# Patient Record
Sex: Female | Born: 1995 | Race: Asian | Hispanic: No | Marital: Single | Smoking: Never smoker
Health system: Southern US, Community
[De-identification: ages and names within clinical notes are randomized; demographics above are authoritative.]

## PROBLEM LIST (undated history)

## (undated) DIAGNOSIS — Z789 Other specified health status: Secondary | ICD-10-CM

## (undated) DIAGNOSIS — R55 Syncope and collapse: Secondary | ICD-10-CM

## (undated) HISTORY — PX: NO PAST SURGERIES: SHX2092

## (undated) HISTORY — PX: DILATION AND CURETTAGE OF UTERUS: SHX78

---
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
## 2015-01-22 NOTE — L&D Delivery Note (Signed)
Delivery Note At 9:02 AM a viable female was delivered via Vaginal, Spontaneous Delivery (Presentation: ROA ; Vertex).  APGAR: 9, 9; weight pending .   Placenta status: delivered with gently traction.  Cord: 3 vessel with the following complications: nuchal x1.  Cord pH: not colected  Anesthesia:  Lidocaine for repair Episiotomy: None Lacerations: 1st degree;Perineal Suture Repair: 3.0 vicryl Est. Blood Loss (mL):  100  Mom to postpartum.  Baby to Couplet care / Skin to Skin.  Kathleen Pennaicholas Zavior Bell 11/18/2015, 9:16 AM

## 2015-05-08 ENCOUNTER — Encounter (HOSPITAL_COMMUNITY): Payer: Self-pay

## 2015-05-08 DIAGNOSIS — R1084 Generalized abdominal pain: Secondary | ICD-10-CM | POA: Insufficient documentation

## 2015-05-08 DIAGNOSIS — R197 Diarrhea, unspecified: Secondary | ICD-10-CM | POA: Diagnosis not present

## 2015-05-08 DIAGNOSIS — R42 Dizziness and giddiness: Secondary | ICD-10-CM | POA: Diagnosis not present

## 2015-05-08 DIAGNOSIS — R350 Frequency of micturition: Secondary | ICD-10-CM | POA: Diagnosis not present

## 2015-05-08 DIAGNOSIS — O9989 Other specified diseases and conditions complicating pregnancy, childbirth and the puerperium: Secondary | ICD-10-CM | POA: Diagnosis present

## 2015-05-08 DIAGNOSIS — Z3481 Encounter for supervision of other normal pregnancy, first trimester: Secondary | ICD-10-CM | POA: Insufficient documentation

## 2015-05-08 DIAGNOSIS — Z3A01 Less than 8 weeks gestation of pregnancy: Secondary | ICD-10-CM | POA: Diagnosis not present

## 2015-05-08 DIAGNOSIS — Z79899 Other long term (current) drug therapy: Secondary | ICD-10-CM | POA: Insufficient documentation

## 2015-05-08 DIAGNOSIS — R05 Cough: Secondary | ICD-10-CM | POA: Diagnosis not present

## 2015-05-08 LAB — URINALYSIS, ROUTINE W REFLEX MICROSCOPIC
Bilirubin Urine: NEGATIVE
GLUCOSE, UA: NEGATIVE mg/dL
HGB URINE DIPSTICK: NEGATIVE
Ketones, ur: NEGATIVE mg/dL
Leukocytes, UA: NEGATIVE
Nitrite: NEGATIVE
PH: 6 (ref 5.0–8.0)
Protein, ur: NEGATIVE mg/dL
SPECIFIC GRAVITY, URINE: 1.015 (ref 1.005–1.030)

## 2015-05-08 LAB — COMPREHENSIVE METABOLIC PANEL
ALT: 18 U/L (ref 14–54)
AST: 19 U/L (ref 15–41)
Albumin: 3.8 g/dL (ref 3.5–5.0)
Alkaline Phosphatase: 42 U/L (ref 38–126)
Anion gap: 8 (ref 5–15)
BUN: 6 mg/dL (ref 6–20)
CHLORIDE: 103 mmol/L (ref 101–111)
CO2: 24 mmol/L (ref 22–32)
CREATININE: 0.57 mg/dL (ref 0.44–1.00)
Calcium: 9.5 mg/dL (ref 8.9–10.3)
GFR calc non Af Amer: >60 mL/min (ref >60)
Glucose, Bld: 90 mg/dL (ref 65–99)
POTASSIUM: 3.8 mmol/L (ref 3.5–5.1)
SODIUM: 135 mmol/L (ref 135–145)
Total Bilirubin: 0.6 mg/dL (ref 0.3–1.2)
Total Protein: 7.8 g/dL (ref 6.5–8.1)

## 2015-05-08 LAB — CBC
HEMATOCRIT: 37.5 % (ref 36.0–46.0)
HEMOGLOBIN: 12.7 g/dL (ref 12.0–15.0)
MCH: 29.2 pg (ref 26.0–34.0)
MCHC: 33.9 g/dL (ref 30.0–36.0)
MCV: 86.2 fL (ref 78.0–100.0)
PLATELETS: 242 10*3/uL (ref 150–400)
RBC: 4.35 MIL/uL (ref 3.87–5.11)
RDW: 12.9 % (ref 11.5–15.5)
WBC: 9.2 10*3/uL (ref 4.0–10.5)

## 2015-05-08 LAB — LIPASE, BLOOD: LIPASE: 30 U/L (ref 11–51)

## 2015-05-08 LAB — HCG, QUANTITATIVE, PREGNANCY: hCG, Beta Chain, Quant, S: 190507 m[IU]/mL — ABNORMAL HIGH (ref <5)

## 2015-05-08 MED ORDER — ONDANSETRON 4 MG PO TBDP
4.0000 mg | ORAL_TABLET | Freq: Once | ORAL | Status: AC | PRN
Start: 2015-05-08 — End: 2015-05-08
  Administered 2015-05-08: 4 mg via ORAL

## 2015-05-08 MED ORDER — ONDANSETRON 4 MG PO TBDP
ORAL_TABLET | ORAL | Status: AC
  Filled 2015-05-08: qty 1

## 2015-05-08 NOTE — ED Notes (Addendum)
Pt complaining of abdominal pain and diarrhea since yesterday. Pt complaining of nausea. Pt states she is [redacted]wks pregnant.

## 2015-05-09 ENCOUNTER — Emergency Department (HOSPITAL_COMMUNITY)
Admission: EM | Admit: 2015-05-09 | Discharge: 2015-05-09 | Disposition: A | Discharge status: Home / Self Care | Payer: Medicaid Other | Attending: Emergency Medicine | Admitting: Emergency Medicine

## 2015-05-09 DIAGNOSIS — Z3490 Encounter for supervision of normal pregnancy, unspecified, unspecified trimester: Secondary | ICD-10-CM

## 2015-05-09 MED ORDER — OXYCODONE-ACETAMINOPHEN 5-325 MG PO TABS
1.0000 | ORAL_TABLET | Freq: Once | ORAL | Status: AC
  Administered 2015-05-09: 1 via ORAL
  Filled 2015-05-09: qty 1

## 2015-05-09 NOTE — Discharge Instructions (Signed)
First Trimester of Pregnancy The first trimester of pregnancy is from week 1 until the end of week 12 (months 1 through 3). A week after a sperm fertilizes an egg, the egg will implant on the wall of the uterus. This embryo will begin to develop into a baby. Genes from you and your partner are forming the baby. The female genes determine whether the baby is a boy or a girl. At 6-8 weeks, the eyes and face are formed, and the heartbeat can be seen on ultrasound. At the end of 12 weeks, all the baby's organs are formed.  Now that you are pregnant, you will want to do everything you can to have a healthy baby. Two of the most important things are to get good prenatal care and to follow your health care provider's instructions. Prenatal care is all the medical care you receive before the baby's birth. This care will help prevent, find, and treat any problems during the pregnancy and childbirth. BODY CHANGES Your body goes through many changes during pregnancy. The changes vary from woman to woman.   You may gain or lose a couple of pounds at first.  You may feel sick to your stomach (nauseous) and throw up (vomit). If the vomiting is uncontrollable, call your health care provider.  You may tire easily.  You may develop headaches that can be relieved by medicines approved by your health care provider.  You may urinate more often. Painful urination may mean you have a bladder infection.  You may develop heartburn as a result of your pregnancy.  You may develop constipation because certain hormones are causing the muscles that push waste through your intestines to slow down.  You may develop hemorrhoids or swollen, bulging veins (varicose veins).  Your breasts may begin to grow larger and become tender. Your nipples may stick out more, and the tissue that surrounds them (areola) may become darker.  Your gums may bleed and may be sensitive to brushing and flossing.  Dark spots or blotches (chloasma,  mask of pregnancy) may develop on your face. This will likely fade after the baby is born.  Your menstrual periods will stop.  You may have a loss of appetite.  You may develop cravings for certain kinds of food.  You may have changes in your emotions from day to day, such as being excited to be pregnant or being concerned that something may go wrong with the pregnancy and baby.  You may have more vivid and strange dreams.  You may have changes in your hair. These can include thickening of your hair, rapid growth, and changes in texture. Some women also have hair loss during or after pregnancy, or hair that feels dry or thin. Your hair will most likely return to normal after your baby is born. WHAT TO EXPECT AT YOUR PRENATAL VISITS During a routine prenatal visit:  You will be weighed to make sure you and the baby are growing normally.  Your blood pressure will be taken.  Your abdomen will be measured to track your baby's growth.  The fetal heartbeat will be listened to starting around week 10 or 12 of your pregnancy.  Test results from any previous visits will be discussed. Your health care provider may ask you:  How you are feeling.  If you are feeling the baby move.  If you have had any abnormal symptoms, such as leaking fluid, bleeding, severe headaches, or abdominal cramping.  If you are using any tobacco products,   including cigarettes, chewing tobacco, and electronic cigarettes.  If you have any questions. Other tests that may be performed during your first trimester include:  Blood tests to find your blood type and to check for the presence of any previous infections. They will also be used to check for low iron levels (anemia) and Rh antibodies. Later in the pregnancy, blood tests for diabetes will be done along with other tests if problems develop.  Urine tests to check for infections, diabetes, or protein in the urine.  An ultrasound to confirm the proper growth  and development of the baby.  An amniocentesis to check for possible genetic problems.  Fetal screens for spina bifida and Down syndrome.  You may need other tests to make sure you and the baby are doing well.  HIV (human immunodeficiency virus) testing. Routine prenatal testing includes screening for HIV, unless you choose not to have this test. HOME CARE INSTRUCTIONS  Medicines  Follow your health care provider's instructions regarding medicine use. Specific medicines may be either safe or unsafe to take during pregnancy.  Take your prenatal vitamins as directed.  If you develop constipation, try taking a stool softener if your health care provider approves. Diet  Eat regular, well-balanced meals. Choose a variety of foods, such as meat or vegetable-based protein, fish, milk and low-fat dairy products, vegetables, fruits, and whole grain breads and cereals. Your health care provider will help you determine the amount of weight gain that is right for you.  Avoid raw meat and uncooked cheese. These carry germs that can cause birth defects in the baby.  Eating four or five small meals rather than three large meals a day may help relieve nausea and vomiting. If you start to feel nauseous, eating a few soda crackers can be helpful. Drinking liquids between meals instead of during meals also seems to help nausea and vomiting.  If you develop constipation, eat more high-fiber foods, such as fresh vegetables or fruit and whole grains. Drink enough fluids to keep your urine clear or pale yellow. Activity and Exercise  Exercise only as directed by your health care provider. Exercising will help you:  Control your weight.  Stay in shape.  Be prepared for labor and delivery.  Experiencing pain or cramping in the lower abdomen or low back is a good sign that you should stop exercising. Check with your health care provider before continuing normal exercises.  Try to avoid standing for long  periods of time. Move your legs often if you must stand in one place for a long time.  Avoid heavy lifting.  Wear low-heeled shoes, and practice good posture.  You may continue to have sex unless your health care provider directs you otherwise. Relief of Pain or Discomfort  Wear a good support bra for breast tenderness.   Take warm sitz baths to soothe any pain or discomfort caused by hemorrhoids. Use hemorrhoid cream if your health care provider approves.   Rest with your legs elevated if you have leg cramps or low back pain.  If you develop varicose veins in your legs, wear support hose. Elevate your feet for 15 minutes, 3-4 times a day. Limit salt in your diet. Prenatal Care  Schedule your prenatal visits by the twelfth week of pregnancy. They are usually scheduled monthly at first, then more often in the last 2 months before delivery.  Write down your questions. Take them to your prenatal visits.  Keep all your prenatal visits as directed by your   health care provider. Safety  Wear your seat belt at all times when driving.  Make a list of emergency phone numbers, including numbers for family, friends, the hospital, and police and fire departments. General Tips  Ask your health care provider for a referral to a local prenatal education class. Begin classes no later than at the beginning of month 6 of your pregnancy.  Ask for help if you have counseling or nutritional needs during pregnancy. Your health care provider can offer advice or refer you to specialists for help with various needs.  Do not use hot tubs, steam rooms, or saunas.  Do not douche or use tampons or scented sanitary pads.  Do not cross your legs for long periods of time.  Avoid cat litter boxes and soil used by cats. These carry germs that can cause birth defects in the baby and possibly loss of the fetus by miscarriage or stillbirth.  Avoid all smoking, herbs, alcohol, and medicines not prescribed by  your health care provider. Chemicals in these affect the formation and growth of the baby.  Do not use any tobacco products, including cigarettes, chewing tobacco, and electronic cigarettes. If you need help quitting, ask your health care provider. You may receive counseling support and other resources to help you quit.  Schedule a dentist appointment. At home, brush your teeth with a soft toothbrush and be gentle when you floss. SEEK MEDICAL CARE IF:   You have dizziness.  You have mild pelvic cramps, pelvic pressure, or nagging pain in the abdominal area.  You have persistent nausea, vomiting, or diarrhea.  You have a bad smelling vaginal discharge.  You have pain with urination.  You notice increased swelling in your face, hands, legs, or ankles. SEEK IMMEDIATE MEDICAL CARE IF:   You have a fever.  You are leaking fluid from your vagina.  You have spotting or bleeding from your vagina.  You have severe abdominal cramping or pain.  You have rapid weight gain or loss.  You vomit blood or material that looks like coffee grounds.  You are exposed to German measles and have never had them.  You are exposed to fifth disease or chickenpox.  You develop a severe headache.  You have shortness of breath.  You have any kind of trauma, such as from a fall or a car accident.   This information is not intended to replace advice given to you by your health care provider. Make sure you discuss any questions you have with your health care provider.   Document Released: 01/01/2001 Document Revised: 01/28/2014 Document Reviewed: 11/17/2012 Elsevier Interactive Patient Education 2016 [employer]  

## 2015-05-09 NOTE — ED Provider Notes (Signed)
CSN: [id number]     Arrival date & time 05/08/15  2105 History  By signing my name below, I, Budd Palmer, attest that this documentation has been prepared under the direction and in the presence of Azalia Bilis, MD. Electronically Signed: Budd Palmer, ED Scribe. 05/09/2015. 3:46 AM.      Chief Complaint  Patient presents with  . Abdominal Pain   The history is provided by the patient. No language interpreter was used.   HPI Comments: Kathleen Bell is a 20 y.o. female who presents to the Emergency Department complaining of diffuse abdominal pain onset last night. She reports associated increased urine output and frequency, diarrhea, nausea, vomiting, dizziness, and cough. She notes exacerbation of the pain with palpation. She has taken tylenol for the pain without relief. She states she is currently [redacted] weeks pregnant with her second child, and has a 41 year old at home. She reports G1 P1. Pt denies dysuria, abnormal vaginal bleeding or discharge, and abdominal distension.   Past Medical History  Diagnosis Date  . Fainting    History reviewed. No pertinent past surgical history. History reviewed. No pertinent family history. Social History  Substance Use Topics  . Smoking status: Never Smoker   . Smokeless tobacco: None  . Alcohol Use: No   OB History    No data available     Review of Systems A complete 10 system review of systems was obtained and all systems are negative except as noted in the HPI and PMH.   Allergies  Fish allergy  Home Medications   Prior to Admission medications   Medication Sig Start Date End Date Taking? Authorizing Provider  acetaminophen (TYLENOL) 500 MG tablet Take 1,000 mg by mouth every 6 (six) hours as needed for mild pain.    Yes Historical Provider, MD  Prenatal Vit-Fe Fumarate-FA (PRENATAL MULTIVITAMIN) TABS tablet Take 1 tablet by mouth daily at 12 noon.   Yes Historical Provider, MD   BP 117/79 mmHg  Pulse 114  Temp(Src) 98.3 F (36.8  C) (Oral)  Resp 20  Ht  (1.575 m)  Wt 113 lb 6 oz (51.427 kg)  BMI 20.73 kg/m2  SpO2 98% Physical Exam  Constitutional: She is oriented to person, place, and time. She appears well-developed and well-nourished.  HENT:  Head: Normocephalic.  Eyes: EOM are normal.  Neck: Normal range of motion.  Pulmonary/Chest: Effort normal.  Abdominal: She exhibits no distension. There is tenderness (suprapubic).  Musculoskeletal: Normal range of motion.  Neurological: She is alert and oriented to person, place, and time.  Psychiatric: She has a normal mood and affect.  Nursing note and vitals reviewed.   ED Course  Procedures  DIAGNOSTIC STUDIES: Oxygen Saturation is 98% on RA, normal by my interpretation.    COORDINATION OF CARE: 3:46 AM - Discussed plans to order something for pain and to order a [place]. Pt advised of plan for treatment and pt agrees.    EMERGENCY DEPARTMENT [place] PREGNANCY "Study: Limited Ultrasound of the Pelvis for Pregnancy" INDICATIONS:Pregnancy(required) and Pelvic pain Multiple views of the uterus and pelvic cavity were obtained in real-time with a multi-frequency probe. APPROACH:Transabdominal  PERFORMED BY: Myself IMAGES ARCHIVED?: Yes LIMITATIONS: none PREGNANCY FREE FLUID: None ADNEXAL FINDINGS:No abnormalities noted PREGNANCY FINDINGS: Intrauterine gestational sac noted, Fetal pole present and Fetal heart activity seen INTERPRETATION: Viable intrauterine pregnancy and Pelvic free fluid absent GESTATIONAL AGE, ESTIMATE: 5 weeks FETAL HEART RATE: 158   Labs Review Labs Reviewed  HCG, QUANTITATIVE, PREGNANCY -  Abnormal; Notable for the following:    hCG, Beta Chain, Quant, S [id number] (*)    All other components within normal limits  LIPASE, BLOOD  COMPREHENSIVE METABOLIC PANEL  CBC  URINALYSIS, ROUTINE W REFLEX MICROSCOPIC (NOT AT [facility])    Imaging Review No results found. I have personally reviewed and evaluated these images and lab results as part  of my medical decision-making.   EKG Interpretation None      MDM   Final diagnoses:  Intrauterine pregnancy    Patient overall well-appearing.  Vital signs are normal.  Patient's bedside ultrasound demonstrates intrauterine pregnancy with normal fetal heart rate.  Child noted be moving.  Obstetrical follow-up.  She understands return to the ER for new or worsening symptoms  I personally performed the services described in this documentation, which was scribed in my presence. The recorded information has been reviewed and is accurate.       Azalia BilisKevin Laquetta Racey, MD 05/10/15 [phone]

## 2015-05-18 ENCOUNTER — Other Ambulatory Visit (HOSPITAL_COMMUNITY): Payer: Self-pay | Admitting: Nurse Practitioner

## 2015-05-18 DIAGNOSIS — Z3A12 12 weeks gestation of pregnancy: Secondary | ICD-10-CM

## 2015-05-18 DIAGNOSIS — Z3682 Encounter for antenatal screening for nuchal translucency: Secondary | ICD-10-CM

## 2015-05-18 LAB — OB RESULTS CONSOLE ABO/RH: RH TYPE: POSITIVE

## 2015-05-18 LAB — OB RESULTS CONSOLE HEPATITIS B SURFACE ANTIGEN: Hepatitis B Surface Ag: NEGATIVE

## 2015-05-18 LAB — OB RESULTS CONSOLE RPR: RPR: NONREACTIVE

## 2015-05-18 LAB — OB RESULTS CONSOLE GC/CHLAMYDIA
Chlamydia: NEGATIVE
GC PROBE AMP, GENITAL: NEGATIVE

## 2015-05-18 LAB — OB RESULTS CONSOLE RUBELLA ANTIBODY, IGM: RUBELLA: IMMUNE

## 2015-05-18 LAB — OB RESULTS CONSOLE HIV ANTIBODY (ROUTINE TESTING): HIV: NONREACTIVE

## 2015-05-18 LAB — OB RESULTS CONSOLE ANTIBODY SCREEN: Antibody Screen: NEGATIVE

## 2015-05-19 ENCOUNTER — Ambulatory Visit (HOSPITAL_COMMUNITY)
Admission: RE | Admit: 2015-05-19 | Discharge: 2015-05-19 | Disposition: A | Discharge status: Home / Self Care | Payer: Medicaid Other | Source: Ambulatory Visit | Attending: Nurse Practitioner | Admitting: Nurse Practitioner

## 2015-05-19 ENCOUNTER — Encounter (HOSPITAL_COMMUNITY): Payer: Self-pay

## 2015-05-19 DIAGNOSIS — Z36 Encounter for antenatal screening of mother: Secondary | ICD-10-CM | POA: Insufficient documentation

## 2015-05-19 DIAGNOSIS — Z3A12 12 weeks gestation of pregnancy: Secondary | ICD-10-CM | POA: Insufficient documentation

## 2015-05-19 DIAGNOSIS — Z3682 Encounter for antenatal screening for nuchal translucency: Secondary | ICD-10-CM

## 2015-05-29 ENCOUNTER — Other Ambulatory Visit (HOSPITAL_COMMUNITY): Payer: Self-pay

## 2015-06-12 ENCOUNTER — Ambulatory Visit (HOSPITAL_COMMUNITY)
Admission: EM | Admit: 2015-06-12 | Discharge: 2015-06-12 | Disposition: A | Discharge status: Home / Self Care | Payer: Medicaid Other | Attending: Family Medicine | Admitting: Family Medicine

## 2015-06-12 ENCOUNTER — Encounter (HOSPITAL_COMMUNITY): Payer: Self-pay | Admitting: Emergency Medicine

## 2015-06-12 DIAGNOSIS — L299 Pruritus, unspecified: Secondary | ICD-10-CM

## 2015-06-12 MED ORDER — CETIRIZINE HCL 10 MG PO TABS: 10.0000 mg | ORAL_TABLET | Freq: Every day | ORAL | Status: DC

## 2015-06-12 NOTE — ED Notes (Addendum)
[facility] a complaint of itching in her arms and hands x 2 months. The patient stated that the itching has gotten worse over the last several days. The patient denied any rash and none was obviously present.

## 2015-06-12 NOTE — ED Provider Notes (Signed)
CSN: [id number]     Arrival date & time 06/12/15  1908 History   First MD Initiated Contact with Patient 06/12/15 1944     Chief Complaint  Patient presents with  . Pruritis   (Consider location/radiation/quality/duration/timing/severity/associated sxs/prior Treatment) Patient is a 20 y.o. female presenting with rash. The history is provided by the patient.  Rash Location:  Full body Quality: dryness, itchiness and redness   Severity:  Moderate Duration:  2 months Timing:  Constant Progression:  Worsening (worse over past sev day) Chronicity:  New   Past Medical History  Diagnosis Date  . Fainting    History reviewed. No pertinent past surgical history. History reviewed. No pertinent family history. Social History  Substance Use Topics  . Smoking status: Never Smoker   . Smokeless tobacco: None  . Alcohol Use: No   OB History    Gravida Para Term Preterm AB TAB SAB Ectopic Multiple Living   2         1     Review of Systems  Constitutional: Negative.   Genitourinary: Positive for menstrual problem. Negative for pelvic pain.  Skin: Positive for rash.  All other systems reviewed and are negative.   Allergies  Fish allergy  Home Medications   Prior to Admission medications   Medication Sig Start Date End Date Taking? Authorizing Provider  Prenatal Vit-Fe Fumarate-FA (PRENATAL MULTIVITAMIN) TABS tablet Take 1 tablet by mouth daily at 12 noon.   Yes Historical Provider, MD  acetaminophen (TYLENOL) 500 MG tablet Take 1,000 mg by mouth every 6 (six) hours as needed for mild pain.     Historical Provider, MD  cetirizine (ZYRTEC) 10 MG tablet Take 1 tablet (10 mg total) by mouth daily. One tab daily for itching 06/12/15   Linna HoffJames D Ishmail Mcmanamon, MD   Meds Ordered and Administered this Visit  Medications - No data to display  BP 131/66 mmHg  Pulse 67  Temp(Src) 98 F (36.7 C) (Oral)  Resp 18  SpO2 100%  LMP 02/18/2015 No data found.   Physical Exam  Constitutional: She  is oriented to person, place, and time. She appears well-developed and well-nourished.  Neck: Normal range of motion. Neck supple.  Cardiovascular: Normal rate, regular rhythm, normal heart sounds and intact distal pulses.   Pulmonary/Chest: Effort normal and breath sounds normal.  Lymphadenopathy:    She has no cervical adenopathy.  Neurological: She is alert and oriented to person, place, and time.  Skin: Skin is warm and dry. No rash noted.  Nursing note and vitals reviewed.   ED Course  Procedures (including critical care time)  Labs Review Labs Reviewed - No data to display  Imaging Review No results found.   Visual Acuity Review  Right Eye Distance:   Left Eye Distance:   Bilateral Distance:    Right Eye Near:   Left Eye Near:    Bilateral Near:         MDM   1. Itchy skin        Linna HoffJames D Marcellina Jonsson, MD 06/13/15 [phone]

## 2015-06-12 NOTE — Discharge Instructions (Signed)
Use eucerin lotion for itching, take medicine as prescribed, it may be related to pregnancy.

## 2015-06-22 ENCOUNTER — Encounter (HOSPITAL_COMMUNITY): Payer: Self-pay

## 2015-06-22 ENCOUNTER — Ambulatory Visit (HOSPITAL_COMMUNITY)
Admission: RE | Admit: 2015-06-22 | Discharge: 2015-06-22 | Disposition: A | Discharge status: Home / Self Care | Payer: Medicaid Other | Source: Ambulatory Visit | Attending: Nurse Practitioner | Admitting: Nurse Practitioner

## 2015-06-22 DIAGNOSIS — O289 Unspecified abnormal findings on antenatal screening of mother: Secondary | ICD-10-CM

## 2015-06-22 DIAGNOSIS — Z3A17 17 weeks gestation of pregnancy: Secondary | ICD-10-CM | POA: Insufficient documentation

## 2015-06-22 DIAGNOSIS — O280 Abnormal hematological finding on antenatal screening of mother: Secondary | ICD-10-CM | POA: Insufficient documentation

## 2015-06-22 NOTE — Progress Notes (Signed)
Genetic Counseling  High-Risk Gestation Note  Appointment Date:  06/22/2015 Referred By: Alberteen Spindlearson, Ashley C, NP Date of Birth:  [birth year]   Pregnancy History: G2P0 Estimated Date of Delivery: 11/25/15 Estimated Gestational Age: 5076w5d Attending: Particia NearingMartha Decker, MD    Ms. Hlido Bollard was seen for genetic counseling because of an increased risk for fetal Down syndrome based on first trimester screening through [facility].  In summary:  Reviewed results of screening test  Increased risk for Down syndrome (1 in 309)  Discussed additional screening options  NIPS-patient declined  [facility]  Discussed diagnostic testing options  Amniocentesis-patient declined  Reviewed family history  Discussed general population carrier screening options: patient declined additional screening today.   She was counseled regarding the screening result and the associated 1 in 309 risk for fetal Down syndrome.  We reviewed chromosomes, nondisjunction, and the common features and variable prognosis of Down syndrome.  In addition, we reviewed the screen adjusted reduction in risks for trisomy 18/13 (1 in 2054 to 1 in >10,000).  We also discussed other explanations for a screen positive result including: differences in maternal metabolism and normal variation.  We reviewed other available screening options including noninvasive prenatal screening (NIPS)/cell free DNA (cfDNA) screening and detailed ultrasound.  She was counseled that screening tests are used to modify a patient's a priori risk for aneuploidy, typically based on age. This estimate provides a pregnancy specific risk assessment. We reviewed the benefits and limitations of each option. Specifically, we discussed the conditions for which each test screens, the detection rates, and false positive rates of each. She was also counseled regarding diagnostic testing via amniocentesis. We reviewed the  approximate 1 in 300-500 risk for complications from amniocentesis, including spontaneous pregnancy loss. We discussed the possible results that the tests might provide including: positive, negative, unanticipated, and no result. Finally, they were counseled regarding the cost of each option and potential out of pocket expenses. After consideration of all the options, she elected to proceed with targeted ultrasound only, which is scheduled for 06/29/15. She declined NIPS and amniocentesis, stating that these tests would potentially cause her to worry more at this time.   She understands that screening tests cannot rule out all birth defects or genetic syndromes. The patient was advised of this limitation and states she still does not want additional testing at this time.   Ms. Valentina LucksHlido Barba was provided with written information regarding cystic fibrosis (CF) including the carrier frequency and incidence in the Asian population, the availability of carrier testing and prenatal diagnosis if indicated.  In addition, we discussed that CF is routinely screened for as part of the [place] newborn screening panel.  She declined additional testing today.   Both family histories were reviewed and found to be noncontributory for birth defects, intellectual disability, recurrent pregnancy loss, and known genetic conditions. Without further information regarding the provided family history, an accurate genetic risk cannot be calculated. Further genetic counseling is warranted if more information is obtained.  Ms. Shirlee LatchRomah denied exposure to environmental toxins or chemical agents. She denied the use of alcohol, tobacco or street drugs. She denied significant viral illnesses during the course of her pregnancy. Her medical and surgical histories were noncontributory.   I counseled Ms. Hlido Domke for approximately 45 minutes regarding the above risks and available options.   Quinn PlowmanKaren Farah Lepak, MS,  Certified Genetic  Counselor 06/22/2015

## 2015-06-29 ENCOUNTER — Ambulatory Visit (HOSPITAL_COMMUNITY)
Admission: RE | Admit: 2015-06-29 | Discharge: 2015-06-29 | Disposition: A | Discharge status: Home / Self Care | Payer: Medicaid Other | Source: Ambulatory Visit | Attending: Nurse Practitioner | Admitting: Nurse Practitioner

## 2015-06-29 ENCOUNTER — Encounter (HOSPITAL_COMMUNITY): Payer: Self-pay

## 2015-06-29 VITALS — BP 112/69 | HR 109 | Wt 118.1 lb

## 2015-06-29 DIAGNOSIS — O289 Unspecified abnormal findings on antenatal screening of mother: Secondary | ICD-10-CM

## 2015-06-29 DIAGNOSIS — Z3A18 18 weeks gestation of pregnancy: Secondary | ICD-10-CM | POA: Diagnosis not present

## 2015-06-29 DIAGNOSIS — O283 Abnormal ultrasonic finding on antenatal screening of mother: Secondary | ICD-10-CM | POA: Insufficient documentation

## 2015-06-29 DIAGNOSIS — O280 Abnormal hematological finding on antenatal screening of mother: Secondary | ICD-10-CM

## 2015-07-06 ENCOUNTER — Other Ambulatory Visit (HOSPITAL_COMMUNITY): Payer: Self-pay

## 2015-10-30 LAB — OB RESULTS CONSOLE GBS: STREP GROUP B AG: NEGATIVE

## 2015-11-13 ENCOUNTER — Inpatient Hospital Stay (HOSPITAL_COMMUNITY)
Admission: AD | Admit: 2015-11-13 | Discharge: 2015-11-13 | Disposition: A | Discharge status: Home / Self Care | Payer: Medicaid Other | Source: Ambulatory Visit | Attending: Obstetrics and Gynecology | Admitting: Obstetrics and Gynecology

## 2015-11-13 ENCOUNTER — Encounter (HOSPITAL_COMMUNITY): Payer: Self-pay | Admitting: *Deleted

## 2015-11-13 DIAGNOSIS — Z3A00 Weeks of gestation of pregnancy not specified: Secondary | ICD-10-CM | POA: Diagnosis not present

## 2015-11-13 DIAGNOSIS — Z3483 Encounter for supervision of other normal pregnancy, third trimester: Secondary | ICD-10-CM | POA: Insufficient documentation

## 2015-11-13 HISTORY — DX: Other specified health status: Z78.9

## 2015-11-13 LAB — AMNISURE RUPTURE OF MEMBRANE (ROM) NOT AT ARMC: Amnisure ROM: NEGATIVE

## 2015-11-13 NOTE — Discharge Instructions (Signed)
Braxton Hicks Contractions °Contractions of the uterus can occur throughout pregnancy. Contractions are not always a sign that you are in labor.  °WHAT ARE BRAXTON HICKS CONTRACTIONS?  °Contractions that occur before labor are called Braxton Hicks contractions, or false labor. Toward the end of pregnancy (32-34 weeks), these contractions can develop more often and may become more forceful. This is not true labor because these contractions do not result in opening (dilatation) and thinning of the cervix. They are sometimes difficult to tell apart from true labor because these contractions can be forceful and people have different pain tolerances. You should not feel embarrassed if you go to the hospital with false labor. Sometimes, the only way to tell if you are in true labor is for your health care provider to look for changes in the cervix. °If there are no prenatal problems or other health problems associated with the pregnancy, it is completely safe to be sent home with false labor and await the onset of true labor. °HOW CAN YOU TELL THE DIFFERENCE BETWEEN TRUE AND FALSE LABOR? °False Labor °· The contractions of false labor are usually shorter and not as hard as those of true labor.   °· The contractions are usually irregular.   °· The contractions are often felt in the front of the lower abdomen and in the groin.   °· The contractions may go away when you walk around or change positions while lying down.   °· The contractions get weaker and are shorter lasting as time goes on.   °· The contractions do not usually become progressively stronger, regular, and closer together as with true labor.   °True Labor °· Contractions in true labor last 30-70 seconds, become very regular, usually become more intense, and increase in frequency.   °· The contractions do not go away with walking.   °· The discomfort is usually felt in the top of the uterus and spreads to the lower abdomen and low back.   °· True labor can be  determined by your health care provider with an exam. This will show that the cervix is dilating and getting thinner.   °WHAT TO REMEMBER °· Keep up with your usual exercises and follow other instructions given by your health care provider.   °· Take medicines as directed by your health care provider.   °· Keep your regular prenatal appointments.   °· Eat and drink lightly if you think you are going into labor.   °· If Braxton Hicks contractions are making you uncomfortable:   °¨ Change your position from lying down or resting to walking, or from walking to resting.   °¨ Sit and rest in a tub of warm water.   °¨ Drink 2-3 glasses of water. Dehydration may cause these contractions.   °¨ Do slow and deep breathing several times an hour.   °WHEN SHOULD I SEEK IMMEDIATE MEDICAL CARE? °Seek immediate medical care if: °· Your contractions become stronger, more regular, and closer together.   °· You have fluid leaking or gushing from your vagina.   °· You have a fever.   °· You pass blood-tinged mucus.   °· You have vaginal bleeding.   °· You have continuous abdominal pain.   °· You have low back pain that you never had before.   °· You feel your baby's head pushing down and causing pelvic pressure.   °· Your baby is not moving as much as it used to.   °  °This information is not intended to replace advice given to you by your health care provider. Make sure you discuss any questions you have with your health care   provider. °  °Document Released: 01/07/2005 Document Revised: 01/12/2013 Document Reviewed: 10/19/2012 °Elsevier Interactive Patient Education ©2016 [employer] ° °

## 2015-11-13 NOTE — MAU Note (Signed)
Patient presents to mau with c/o leaking clear fluids since 330pm. Occassional contractions. Denies vb at this time. +FM

## 2015-11-18 ENCOUNTER — Encounter (HOSPITAL_COMMUNITY): Payer: Self-pay | Admitting: *Deleted

## 2015-11-18 ENCOUNTER — Inpatient Hospital Stay (HOSPITAL_COMMUNITY)
Admission: AD | Admit: 2015-11-18 | Discharge: 2015-11-20 | DRG: 775 | Disposition: A | Discharge status: Home / Self Care | Payer: Medicaid Other | Source: Ambulatory Visit | Attending: Obstetrics & Gynecology | Admitting: Obstetrics & Gynecology

## 2015-11-18 DIAGNOSIS — O4202 Full-term premature rupture of membranes, onset of labor within 24 hours of rupture: Secondary | ICD-10-CM | POA: Diagnosis present

## 2015-11-18 DIAGNOSIS — O133 Gestational [pregnancy-induced] hypertension without significant proteinuria, third trimester: Secondary | ICD-10-CM

## 2015-11-18 DIAGNOSIS — Z3A39 39 weeks gestation of pregnancy: Secondary | ICD-10-CM

## 2015-11-18 DIAGNOSIS — O4290 Premature rupture of membranes, unspecified as to length of time between rupture and onset of labor, unspecified weeks of gestation: Secondary | ICD-10-CM

## 2015-11-18 DIAGNOSIS — O280 Abnormal hematological finding on antenatal screening of mother: Secondary | ICD-10-CM

## 2015-11-18 LAB — TYPE AND SCREEN
ABO/RH(D): O POS
Antibody Screen: NEGATIVE

## 2015-11-18 LAB — CBC
HCT: 38 % (ref 36.0–46.0)
HEMOGLOBIN: 12.7 g/dL (ref 12.0–15.0)
MCH: 31.4 pg (ref 26.0–34.0)
MCHC: 31.4 g/dL (ref 30.0–36.0)
MCV: 94.1 fL (ref 78.0–100.0)
PLATELETS: 161 10*3/uL (ref 150–400)
RBC: 4.04 MIL/uL (ref 3.87–5.11)
RDW: 33.4 % — ABNORMAL HIGH (ref 11.5–15.5)
WBC: 8.5 10*3/uL (ref 4.0–10.5)

## 2015-11-18 LAB — COMPREHENSIVE METABOLIC PANEL
ALK PHOS: 137 U/L — AB (ref 38–126)
ALT: 15 U/L (ref 14–54)
AST: 26 U/L (ref 15–41)
Albumin: 3.2 g/dL — ABNORMAL LOW (ref 3.5–5.0)
Anion gap: 9 (ref 5–15)
BUN: 7 mg/dL (ref 6–20)
CALCIUM: 9.2 mg/dL (ref 8.9–10.3)
CO2: 22 mmol/L (ref 22–32)
CREATININE: 0.59 mg/dL (ref 0.44–1.00)
Chloride: 104 mmol/L (ref 101–111)
Glucose, Bld: 86 mg/dL (ref 65–99)
Potassium: 3.9 mmol/L (ref 3.5–5.1)
Sodium: 135 mmol/L (ref 135–145)
Total Bilirubin: 0.8 mg/dL (ref 0.3–1.2)
Total Protein: 7 g/dL (ref 6.5–8.1)

## 2015-11-18 LAB — URIC ACID: URIC ACID, SERUM: 7 mg/dL — AB (ref 2.3–6.6)

## 2015-11-18 LAB — RPR: RPR: NONREACTIVE

## 2015-11-18 LAB — ABO/RH: ABO/RH(D): O POS

## 2015-11-18 MED ORDER — HYDRALAZINE HCL 20 MG/ML IJ SOLN: 10.0000 mg | Freq: Once | INTRAMUSCULAR | Status: DC | PRN

## 2015-11-18 MED ORDER — SENNOSIDES-DOCUSATE SODIUM 8.6-50 MG PO TABS
2.0000 | ORAL_TABLET | ORAL | Status: DC
  Administered 2015-11-18 – 2015-11-19 (×2): 2 via ORAL
  Filled 2015-11-18 (×2): qty 2

## 2015-11-18 MED ORDER — ONDANSETRON HCL 4 MG PO TABS
4.0000 mg | ORAL_TABLET | ORAL | Status: DC | PRN
Start: 2015-11-18 — End: 2015-11-20

## 2015-11-18 MED ORDER — FENTANYL CITRATE (PF) 100 MCG/2ML IJ SOLN
50.0000 ug | INTRAMUSCULAR | Status: DC | PRN
  Filled 2015-11-18: qty 2

## 2015-11-18 MED ORDER — ZOLPIDEM TARTRATE 5 MG PO TABS: 5.0000 mg | ORAL_TABLET | Freq: Every evening | ORAL | Status: DC | PRN

## 2015-11-18 MED ORDER — LIDOCAINE HCL (PF) 1 % IJ SOLN
INTRAMUSCULAR | Status: AC
  Administered 2015-11-18: 30 mL via SUBCUTANEOUS
  Filled 2015-11-18: qty 30

## 2015-11-18 MED ORDER — OXYCODONE-ACETAMINOPHEN 5-325 MG PO TABS: 1.0000 | ORAL_TABLET | ORAL | Status: DC | PRN

## 2015-11-18 MED ORDER — SIMETHICONE 80 MG PO CHEW
80.0000 mg | CHEWABLE_TABLET | ORAL | Status: DC | PRN
Start: 2015-11-18 — End: 2015-11-20

## 2015-11-18 MED ORDER — ONDANSETRON HCL 4 MG/2ML IJ SOLN: 4.0000 mg | INTRAMUSCULAR | Status: DC | PRN

## 2015-11-18 MED ORDER — BENZOCAINE-MENTHOL 20-0.5 % EX AERO: 1.0000 "application " | INHALATION_SPRAY | CUTANEOUS | Status: DC | PRN

## 2015-11-18 MED ORDER — ONDANSETRON HCL 4 MG/2ML IJ SOLN: 4.0000 mg | Freq: Four times a day (QID) | INTRAMUSCULAR | Status: DC | PRN

## 2015-11-18 MED ORDER — IBUPROFEN 600 MG PO TABS
600.0000 mg | ORAL_TABLET | Freq: Four times a day (QID) | ORAL | Status: DC
  Administered 2015-11-18 – 2015-11-20 (×9): 600 mg via ORAL
  Filled 2015-11-18 (×9): qty 1

## 2015-11-18 MED ORDER — OXYTOCIN BOLUS FROM INFUSION
500.0000 mL | Freq: Once | INTRAVENOUS | Status: AC
Start: 2015-11-18 — End: 2015-11-18
  Administered 2015-11-18: 500 mL via INTRAVENOUS

## 2015-11-18 MED ORDER — DIPHENHYDRAMINE HCL 25 MG PO CAPS: 25.0000 mg | ORAL_CAPSULE | Freq: Four times a day (QID) | ORAL | Status: DC | PRN

## 2015-11-18 MED ORDER — LACTATED RINGERS IV SOLN: 500.0000 mL | INTRAVENOUS | Status: DC | PRN

## 2015-11-18 MED ORDER — LIDOCAINE HCL (PF) 1 % IJ SOLN
30.0000 mL | INTRAMUSCULAR | Status: AC | PRN
  Administered 2015-11-18: 30 mL via SUBCUTANEOUS
  Filled 2015-11-18: qty 30

## 2015-11-18 MED ORDER — LABETALOL HCL 5 MG/ML IV SOLN: 20.0000 mg | INTRAVENOUS | Status: DC | PRN

## 2015-11-18 MED ORDER — ACETAMINOPHEN 325 MG PO TABS
650.0000 mg | ORAL_TABLET | ORAL | Status: DC | PRN
  Administered 2015-11-20: 650 mg via ORAL
  Filled 2015-11-18: qty 2

## 2015-11-18 MED ORDER — ACETAMINOPHEN 325 MG PO TABS: 650.0000 mg | ORAL_TABLET | ORAL | Status: DC | PRN

## 2015-11-18 MED ORDER — TETANUS-DIPHTH-ACELL PERTUSSIS 5-2.5-18.5 LF-MCG/0.5 IM SUSP: 0.5000 mL | Freq: Once | INTRAMUSCULAR | Status: DC

## 2015-11-18 MED ORDER — DIBUCAINE 1 % RE OINT: 1.0000 "application " | TOPICAL_OINTMENT | RECTAL | Status: DC | PRN

## 2015-11-18 MED ORDER — OXYTOCIN 40 UNITS IN LACTATED RINGERS INFUSION - SIMPLE MED
INTRAVENOUS | Status: AC
  Administered 2015-11-18: 500 mL via INTRAVENOUS
  Filled 2015-11-18: qty 1000

## 2015-11-18 MED ORDER — FLEET ENEMA 7-19 GM/118ML RE ENEM: 1.0000 | ENEMA | RECTAL | Status: DC | PRN

## 2015-11-18 MED ORDER — OXYCODONE-ACETAMINOPHEN 5-325 MG PO TABS: 2.0000 | ORAL_TABLET | ORAL | Status: DC | PRN

## 2015-11-18 MED ORDER — OXYTOCIN 40 UNITS IN LACTATED RINGERS INFUSION - SIMPLE MED
2.5000 [IU]/h | INTRAVENOUS | Status: DC
  Administered 2015-11-18: 2.5 [IU]/h via INTRAVENOUS

## 2015-11-18 MED ORDER — COCONUT OIL OIL: 1.0000 "application " | TOPICAL_OIL | Status: DC | PRN

## 2015-11-18 MED ORDER — SOD CITRATE-CITRIC ACID 500-334 MG/5ML PO SOLN: 30.0000 mL | ORAL | Status: DC | PRN

## 2015-11-18 MED ORDER — WITCH HAZEL-GLYCERIN EX PADS: 1.0000 "application " | MEDICATED_PAD | CUTANEOUS | Status: DC | PRN

## 2015-11-18 MED ORDER — INFLUENZA VAC SPLIT QUAD 0.5 ML IM SUSY
0.5000 mL | PREFILLED_SYRINGE | INTRAMUSCULAR | Status: AC
  Administered 2015-11-19: 0.5 mL via INTRAMUSCULAR

## 2015-11-18 MED ORDER — LACTATED RINGERS IV SOLN
INTRAVENOUS | Status: DC
  Administered 2015-11-18: 09:00:00 via INTRAVENOUS

## 2015-11-18 NOTE — MAU Provider Note (Signed)
See H& P.

## 2015-11-18 NOTE — MAU Note (Signed)
Thinks water broke at 0630 clear fluid and then the pain started

## 2015-11-18 NOTE — Lactation Note (Signed)
This note was copied from a baby's chart. Lactation Consultation Note  Patient Name: Girl Hlido Orvis Today's Date: 11/18/2015 Reason for consult: Initial assessment   Of the mom and term baby, now 5 hours old. The baby fed well after she was born, but has been sleepy since. I changes a large, meconium stool, and then placed baby STS. Mom likes cradle hold, but agreed to try cross cradle for latching, and then going to cradle. The baby had a very stuffy nose, so I instilled a couple of drops of cold NS into each nostril. She seemed breathing a little better after this, but still not interested in feeding. Basic teaching from the baby and me book done, as well as brief review of lactation services. I left baby skin to skin with mom, and encouraged mom to keep baby STS as much as possible.   Maternal Data Formula Feeding for Exclusion: No Has patient been taught Hand Expression?: Yes Does the patient have breastfeeding experience prior to this delivery?: Yes  Feeding Feeding Type: Breast Fed Length of feed: 0 min  LATCH Score/Interventions Latch: Too sleepy or reluctant, no latch achieved, no sucking elicited. Intervention(s): Skin to skin;Teach feeding cues;Waking techniques Intervention(s): Adjust position;Assist with latch;Breast massage;Breast compression  Audible Swallowing: None Intervention(s): Skin to skin;Hand expression  Type of Nipple: Everted at rest and after stimulation  Comfort (Breast/Nipple): Soft / non-tender     Hold (Positioning): Assistance needed to correctly position infant at breast and maintain latch. Intervention(s): Breastfeeding basics reviewed;Support Pillows;Position options;Skin to skin  LATCH Score: 5  Lactation Tools Discussed/Used     Consult Status Consult Status: Follow-up Date: 11/19/15 Follow-up type: In-patient    Alfred LevinsLee, Guneet Delpino Anne 11/18/2015, 2:30 PM

## 2015-11-18 NOTE — H&P (Signed)
HPI: Kathleen Bell is a 20 y.o. year old 572P1001 female at 8273w0d weeks gestation by LMP and 12 week US who presents to MAU reporting Spontaneous rupture of membranes at 0600 and Labor. BP elevated in MAU and [facility]  Denies HA, vision changes or epigastric pain.   Pregnancy significant for elevated DSR 1:309 on first trimester screen. Declined NIPS, amnio.    OB History    Gravida Para Term Preterm AB Living   2 1 1     1    SAB TAB Ectopic Multiple Live Births                 Past Medical History:  Diagnosis Date  . Fainting   . Medical history non-contributory    Past Surgical History:  Procedure Laterality Date  . DILATION AND CURETTAGE OF UTERUS     for retained placenta  . NO PAST SURGERIES     Family History: family history is not on file. Social History:  reports that she has never smoked. She has never used smokeless tobacco. She reports that she does not drink alcohol or use drugs.     Maternal Diabetes: No Genetic Screening: Abnormal:  Results: Elevated risk of Trisomy 21 Maternal Ultrasounds/Referrals: Normal Fetal Ultrasounds or other Referrals:  Referred to Materal Fetal Medicine  Maternal Substance Abuse:  No Significant Maternal Medications:  None Significant Maternal Lab Results:  Lab values include: Group B Strep negative Other Comments:  None  Review of Systems  Constitutional: Negative for chills and fever.  Eyes: Negative for blurred vision.  Gastrointestinal: Positive for abdominal pain.  Genitourinary:       Pos for LOF. Neg for VB.   Neurological: Negative for headaches.   Maternal Medical History:  Reason for admission: Rupture of membranes and contractions.   Contractions: Onset was 1-2 hours ago.   Frequency: regular.   Perceived severity is strong.    Fetal activity: Perceived fetal activity is normal.   Last perceived fetal movement was within the past hour.    Prenatal  Complications - Diabetes: none.    Dilation: 5 Effacement (%): 90 Station: -2 Exam by:: Kathleen Bell, CNM Grossly ruptured lear fluid.   Blood pressure (!) 137/112, pulse 111, temperature 97.9 F (36.6 C), temperature source Oral, resp. rate 20, height 5\' 1"  (1.549 m), weight 149 lb 6.4 oz (67.8 kg), last menstrual period 02/18/2015.  Patient Vitals for the past 24 hrs:  BP Temp Temp src Pulse Resp Height Weight  11/18/15 0800 147/97 - - 81 - - -  11/18/15 0750 (!) 148/109 - - 87 - - -  11/18/15 0730 (!) 137/112 - - 111 - - -  11/18/15 0721 134/93 97.9 F (36.6 C) Oral 84 20 - -  11/18/15 0709 - - - - - 5\' 1"  (1.549 m) 149 lb 6.4 oz (67.8 kg)    Maternal Exam:  Uterine Assessment: Contraction strength is firm.  Contraction frequency is regular.   Abdomen: Patient reports no abdominal tenderness. Estimated fetal weight is 7-8.   Fetal presentation: vertex  Introitus: Normal vulva. Normal vagina.  Ferning test: positive.  Amniotic fluid character: clear.  Pelvis: adequate for delivery.   Cervix: Cervix evaluated by digital exam.     Fetal Exam Fetal Monitor Review: Mode: ultrasound.   Baseline rate: 120.  Variability: moderate (6-25 bpm).   Pattern: accelerations present and no decelerations.    Fetal State Assessment: Category  I - tracings are normal.     Physical Exam  Nursing note and vitals reviewed. Constitutional: She is oriented to person, place, and time. She appears well-developed and well-nourished. She appears distressed.  HENT:  Head: Normocephalic.  Eyes: Conjunctivae are normal.  Cardiovascular: Normal rate, regular rhythm and normal heart sounds.   Respiratory: Effort normal and breath sounds normal.  GI: Soft. There is no tenderness.  Genitourinary: Vagina normal. There is no lesion on the right labia. There is no lesion on the left labia. No bleeding in the vagina.  Musculoskeletal: She exhibits no edema.  Neurological: She is alert and  oriented to person, place, and time. She has normal reflexes.  Skin: Skin is warm and dry.  Psychiatric: She has a normal mood and affect.    Prenatal labs: ABO, Rh: O/Positive/-- (04/27 0000) Antibody: Negative (04/27 0000) Rubella: Immune (04/27 0000) RPR: Nonreactive (04/27 0000)  HBsAg: Negative (04/27 0000)  HIV: Non-reactive (04/27 0000)  GBS: Negative (10/09 0000)  first trimester screen-abnormal T-21 1:309 risk  Assessment: 1. Labor: Active 2. Fetal Wellbeing: Category I  3. Pain Control: None 4. GBS: Beg 5. 39.0 week IUP 6. Gestational HTN vs Pre-E 7. Increased DSR1:309 risk  Plan:  1. Admit to BS per consult with MD 2. Routine L&D orders 3. Analgesia/anesthesia PRN  4. Pre-E labs 5. Pre-E focused order set. IV labetalol and Meg Sulfate if continued severe-range BP  Kathleen Bell 11/18/2015, 8:12 AM

## 2015-11-19 NOTE — Progress Notes (Signed)
POSTPARTUM PROGRESS NOTE  Post Partum Day 1 Subjective:  Kathleen Bell is a 20 y.o. [id number] 468w0d s/p SVD.  No acute events overnight.  Pt denies problems with ambulating, voiding or po intake.  She denies nausea or vomiting.  Pain is moderately controlled.  She has had flatus. She has had bowel movement.  Lochia Small.   Objective: Blood pressure 111/66, pulse 78, temperature 97.9 F (36.6 C), temperature source Oral, resp. rate 18, height 5\' 1"  (1.549 m), weight 149 lb 6.4 oz (67.8 kg), last menstrual period 02/18/2015, unknown if currently breastfeeding.  Physical Exam:  General: alert, cooperative and no distress Lochia:normal flow Chest: no respiratory distress Heart:regular rate, distal pulses intact Abdomen: soft, nontender,  Uterine Fundus: firm, appropriately tender DVT Evaluation: No calf swelling or tenderness Extremities: trace edema   Recent Labs  11/18/15 0804  HGB 12.7  HCT 38.0    Assessment/Plan:  ASSESSMENT: Kathleen Simkins is a 20 y.o. [id number] 7068w0d s/p RLTCS  Plan for discharge tomorrow   LOS: 1 day   Les Pouicholas SchenkMD 11/19/2015, 8:33 AM

## 2015-11-20 MED ORDER — IBUPROFEN 600 MG PO TABS: 600.0000 mg | ORAL_TABLET | Freq: Four times a day (QID) | ORAL | 0 refills | Status: DC

## 2015-11-20 NOTE — Progress Notes (Signed)
Nurse called to patient's room for sudden, "severe," pain in middle right abdominal quadrant, about 3 inches long. Patient has been passing gas regularly and had her first bowel movement since delivery, at 2100. Vital signs WNL . Bowel sounds active in all quadrants.  Gentle clockwise massage done. Tylenol given.

## 2015-11-20 NOTE — Lactation Note (Signed)
This note was copied from a baby's chart. Lactation Consultation Note: Mother just gave infant 33 ml of fomula. Mother states that she breastfed first . She states that infant gets mad when she offers the breast. Advised mother to offer the alternate breast.  Discussed supply and demand,cluster feeding and cue base feeding. Mother encouraged to massage breast  And ice for 15 mins every 3-4 hours to prevent swelling. Discussed risk of mastitis and S/S.  Advised mother to keep accurate account of infants out put.  Mother informed of lactation  services and community support.   Patient Name: Kathleen Bell [id number]'XToday's Date: 11/20/2015 Reason for consult: Follow-up assessment   Maternal Data    Feeding Feeding Type: Formula Nipple Type: Slow - flow Length of feed: 30 min  LATCH Score/Interventions                      Lactation Tools Discussed/Used     Consult Status      [name], [name] 11/20/2015, 9:49 AM

## 2015-11-20 NOTE — Lactation Note (Signed)
This note was copied from a baby's chart. Lactation Consultation Note Mom had been feeding mostly formula. Only BF for 5 minutes occasionally. Encouraged mom to BF 15-20 min. Mom stated she didn't have enough, only water coming out. Mom has everted nipples w/round breast. Explained consistancy of colostrum and the importance, supply and demand. Mom stated she will BF more after she goes home. Encouraged mom to bf longer now to build milk supply. Mom tired and wants to go to sleep.  Patient Name: Girl Hlido Ventrella WGNFA'OToday's Date: 11/20/2015 Reason for consult: Follow-up assessment   Maternal Data    Feeding    LATCH Score/Interventions       Type of Nipple: Everted at rest and after stimulation  Comfort (Breast/Nipple): Soft / non-tender     Intervention(s): Breastfeeding basics reviewed;Skin to skin     Lactation Tools Discussed/Used     Consult Status Consult Status: Follow-up Date: 11/20/15 Follow-up type: In-patient    Charyl DancerCARVER, Jary Louvier G 11/20/2015, 4:54 AM

## 2015-11-20 NOTE — Discharge Summary (Signed)
OB Discharge Summary  Patient Name: Kathleen Bell DOB: [birth year] MRN: [id number]  Date of admission: 11/18/2015 Delivering MD: Ernestina PennaSCHENK, NICHOLAS MICHAEL   Date of discharge: 11/20/2015  Admitting diagnosis: 39 weeks in  labor Intrauterine pregnancy: 2331w0d     Secondary diagnosis:Active Problems:   Normal labor  Additional problems:none     Discharge diagnosis: Term Pregnancy Delivered                                                                     Post partum procedures:none  Augmentation: n/a  Complications: None  Hospital course:  Onset of Labor With Vaginal Delivery     20 y.o. yo [id number] at 5731w0d was admitted in Active Labor on 11/18/2015. Patient had an uncomplicated labor course as follows:  Membrane Rupture Time/Date: 6:00 AM ,11/18/2015   Intrapartum Procedures: Episiotomy: None [1]                                         Lacerations:  1st degree [2];Perineal [11]  Patient had a delivery of a Viable infant. 11/18/2015  Information for the patient's newborn:  Shirlee LatchRomah, Girl Hlido [[id number]][[id number]]  Delivery Method: Vaginal, Spontaneous Delivery (Filed from Delivery Summary)    Pateint had an uncomplicated postpartum course.  She is ambulating, tolerating a regular diet, passing flatus, and urinating well. Patient is discharged home in stable condition on 11/20/15.    Physical exam Vitals:   11/18/15 2021 11/19/15 0458 11/19/15 1900 11/20/15 0215  BP: 120/76 111/66 122/80 125/76  Pulse: 79 78 83 85  Resp: 18 18 20    Temp: 98 F (36.7 C) 97.9 F (36.6 C) 97 F (36.1 C) 98.6 F (37 C)  TempSrc: Axillary Oral Oral Oral  SpO2:   99% 99%  Weight:      Height:       General: alert, cooperative and no distress Lochia: appropriate Uterine Fundus: firm Incision: N/A DVT Evaluation: No evidence of DVT seen on physical exam. Labs: Lab Results  Component Value Date   WBC 8.5 11/18/2015   HGB 12.7 11/18/2015   HCT 38.0 11/18/2015   MCV 94.1 11/18/2015   PLT  161 11/18/2015   CMP Latest Ref Rng & Units 11/18/2015  Glucose 65 - 99 mg/dL 86  BUN 6 - 20 mg/dL 7  Creatinine 8.460.44 - 9.621.00 mg/dL 9.520.59  Sodium 841135 - 324145 mmol/L 135  Potassium 3.5 - 5.1 mmol/L 3.9  Chloride 101 - 111 mmol/L 104  CO2 22 - 32 mmol/L 22  Calcium 8.9 - 10.3 mg/dL 9.2  Total Protein 6.5 - 8.1 g/dL 7.0  Total Bilirubin 0.3 - 1.2 mg/dL 0.8  Alkaline Phos 38 - 126 U/L 137(H)  AST 15 - 41 U/L 26  ALT 14 - 54 U/L 15    Discharge instruction: per After Visit Summary and "Baby and Me Booklet".   Diet: routine diet  Activity: Advance as tolerated. Pelvic rest for 6 weeks.   Outpatient follow up:6 weeks Follow up Appt:No future appointments. Follow up visit: No Follow-up on file.  Postpartum contraception: Undecided  Newborn Data: Live born female  Birth Weight: 7 lb  3.9 oz (3286 g) APGAR: 9, 9  Baby Feeding: Breast Disposition:home with mother   11/20/2015 Wyvonnia DuskyMarie Soul Hackman, CNM

## 2015-11-20 NOTE — Progress Notes (Signed)
MOB was referred for history of depression/anxiety.  Referral is screened out by Clinical Social Worker because none of the following criteria listed below appear to apply and there are no reports impacting the pregnancy or her transition to the postpartum period. CSW met with MOB at which time she stated she has never suffered from depression and /or anxiety. CSW does not deem it clinically necessary to further investigate at this time.   -History of anxiety/depression during this pregnancy, or of post-partum depression. - Diagnosis of anxiety and/or depression within last 3 years.-  - History of depression due to pregnancy loss/loss of child or -MOB's symptoms are currently being treated with medication and/or therapy.  Please contact the Clinical Social Worker if needs arise or upon MOB request.    Krishav Mamone, MSW, LCSW-A Clinical Social Worker  [facility]  Office: [phone]    

## 2017-05-30 ENCOUNTER — Encounter (HOSPITAL_COMMUNITY): Payer: Self-pay | Admitting: Emergency Medicine

## 2017-05-30 ENCOUNTER — Other Ambulatory Visit: Payer: Self-pay

## 2017-05-30 ENCOUNTER — Emergency Department (HOSPITAL_COMMUNITY)
Admission: EM | Admit: 2017-05-30 | Discharge: 2017-05-30 | Disposition: A | Discharge status: Home / Self Care | Payer: Medicaid Other | Attending: Emergency Medicine | Admitting: Emergency Medicine

## 2017-05-30 ENCOUNTER — Emergency Department (HOSPITAL_COMMUNITY): Payer: Medicaid Other

## 2017-05-30 DIAGNOSIS — M79652 Pain in left thigh: Secondary | ICD-10-CM | POA: Diagnosis not present

## 2017-05-30 DIAGNOSIS — M7918 Myalgia, other site: Secondary | ICD-10-CM

## 2017-05-30 DIAGNOSIS — Y999 Unspecified external cause status: Secondary | ICD-10-CM | POA: Diagnosis not present

## 2017-05-30 DIAGNOSIS — M25562 Pain in left knee: Secondary | ICD-10-CM | POA: Insufficient documentation

## 2017-05-30 DIAGNOSIS — Y92410 Unspecified street and highway as the place of occurrence of the external cause: Secondary | ICD-10-CM | POA: Diagnosis not present

## 2017-05-30 DIAGNOSIS — M545 Low back pain: Secondary | ICD-10-CM | POA: Insufficient documentation

## 2017-05-30 DIAGNOSIS — Y9389 Activity, other specified: Secondary | ICD-10-CM | POA: Diagnosis not present

## 2017-05-30 LAB — POC URINE PREG, ED: PREG TEST UR: NEGATIVE

## 2017-05-30 MED ORDER — CYCLOBENZAPRINE HCL 5 MG PO TABS: 5.0000 mg | ORAL_TABLET | Freq: Three times a day (TID) | ORAL | 0 refills | Status: DC | PRN

## 2017-05-30 MED ORDER — IBUPROFEN 600 MG PO TABS: 600.0000 mg | ORAL_TABLET | Freq: Four times a day (QID) | ORAL | 0 refills | Status: DC | PRN
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
## 2017-12-16 IMAGING — US US MFM FETAL NUCHAL TRANSLUCENCY
2 series · 14 of 28 positions shown · non-contrast
Comparison: none

[Series 1: us mfm fetal nuchal translucency · 61 acquisitions, 12 frames shown (1 of 2)]
[im 3/61]
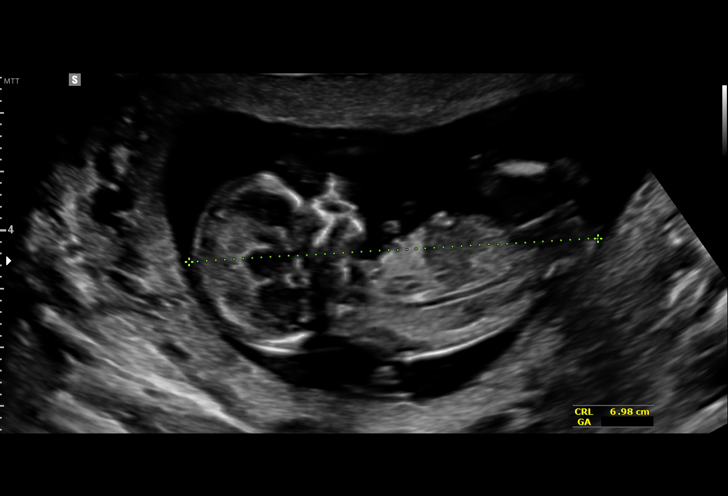
[im 8/61]
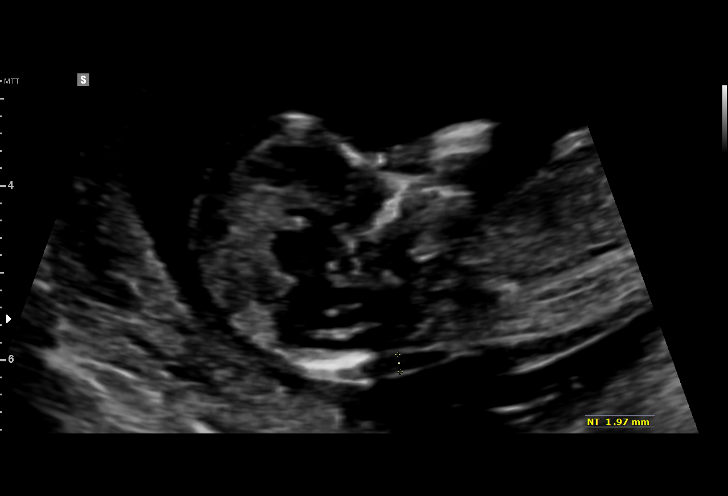
[im 13/61]
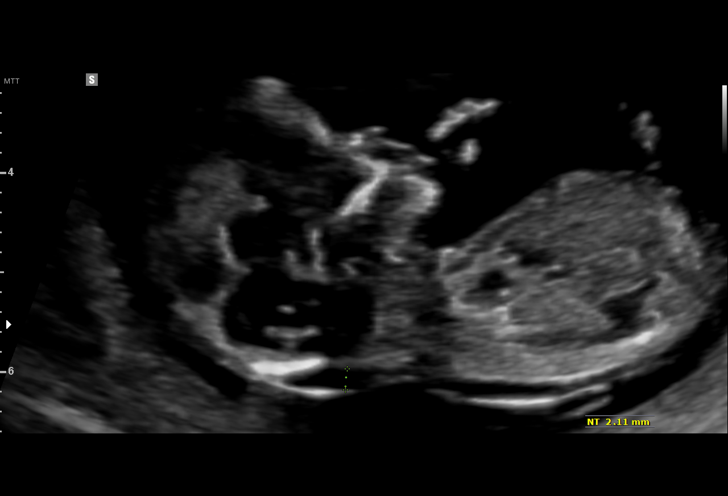
[im 18/61]
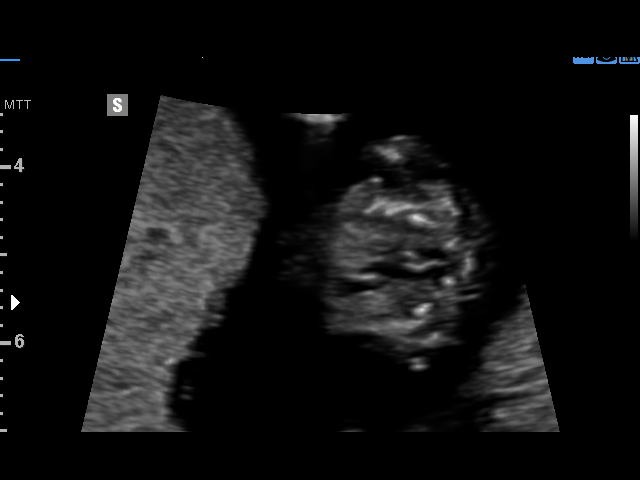
[im 23/61]
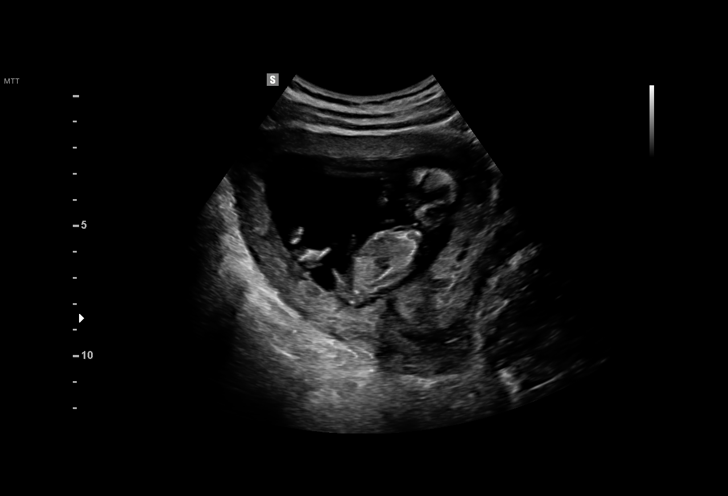
[im 28/61]
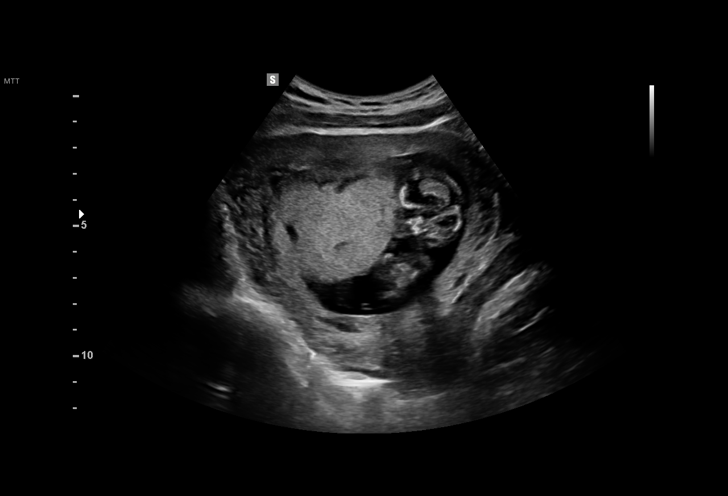
[im 33/61]
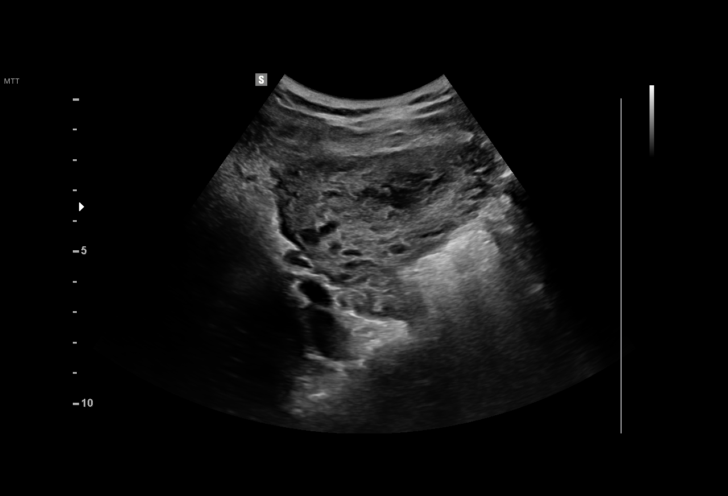
[im 38/61]
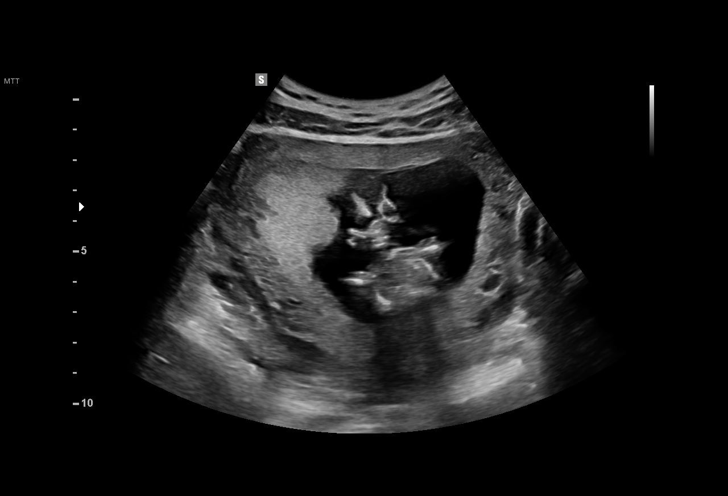
[im 43/61]
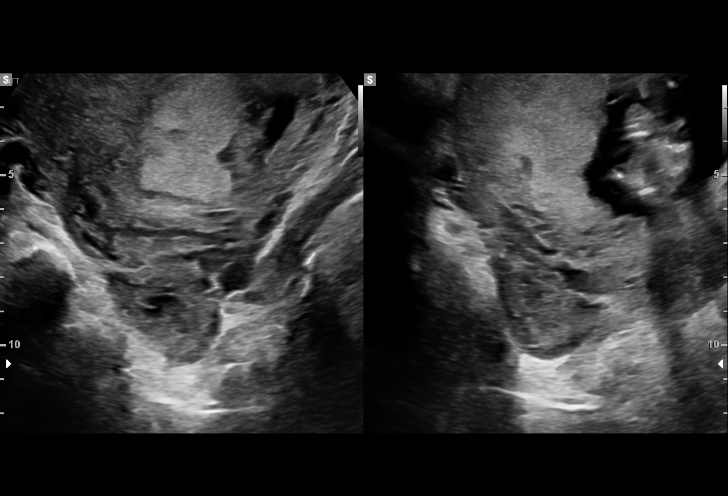
[im 48/61]
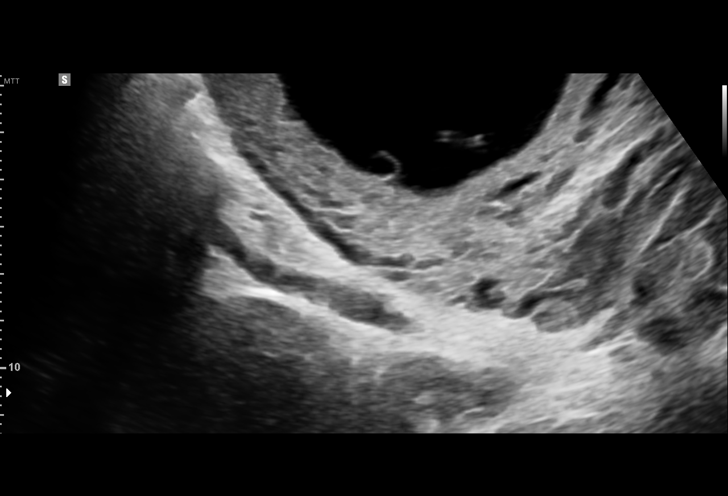
[im 53/61]
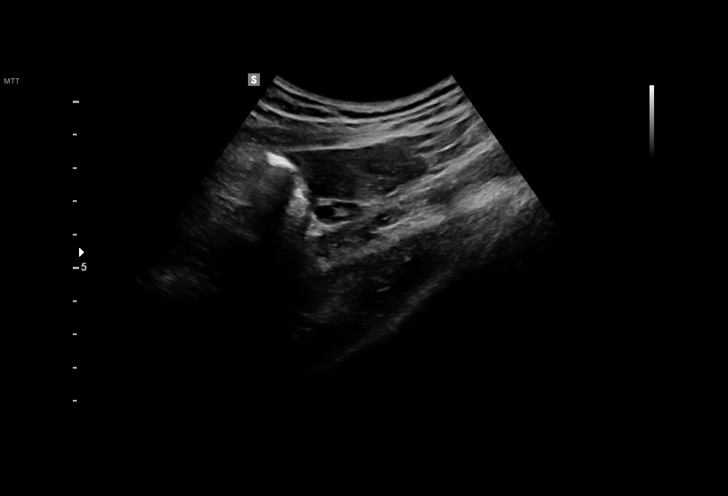
[im 58/61]
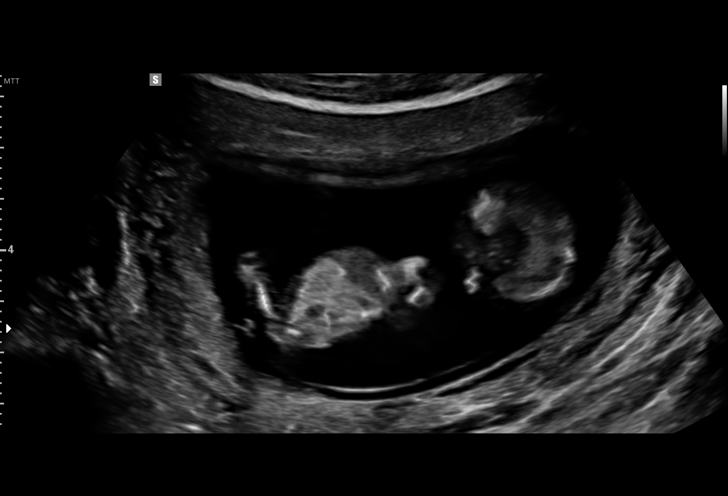

[Series 2: us mfm fetal nuchal translucency · 2 of 8 slices shown (2 of 2)]
[im 1/8]
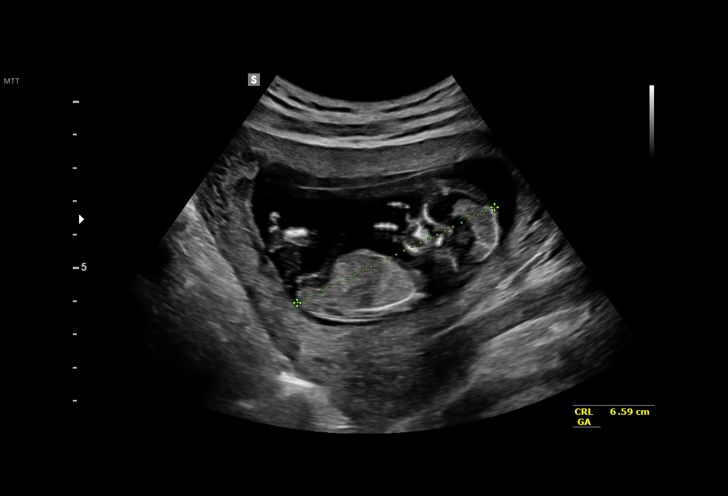
[im 8/8]
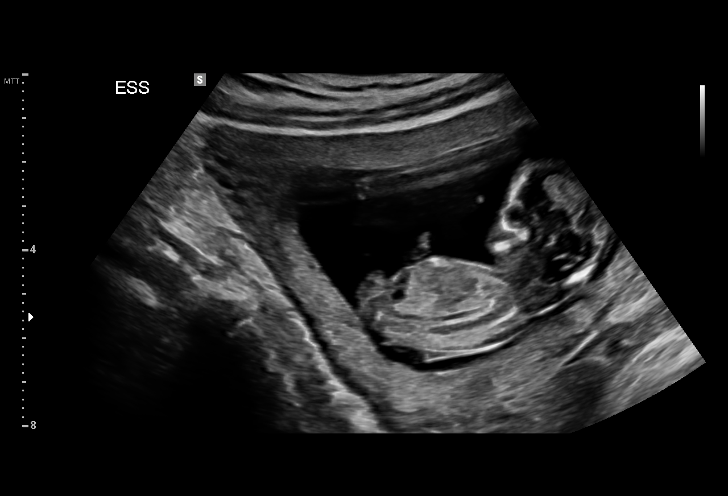

[14 of 28 positions shown; findings below may reference images not displayed]

[REDACTED]-
Faculty Physician
RADHA NP

TRANSLUCENCY

1  BLAIN JUMPER            [id number]       [id number]     [id number]
Indications

12 weeks gestation of pregnancy
First trimester aneuploidy screen (NT)         Z36
OB History

Gravidity:    2         Term:   1        Prem:   0        SAB:   0
TOP:          0       Ectopic:  0        Living: 1
Fetal Evaluation

Num Of Fetuses:     1
Preg. Location:     Intrauterine
Gest. Sac:          Intrauterine
Yolk Sac:
Fetal Pole:         Visualized
Fetal Heart         154
Rate(bpm):
Cardiac Activity:   Observed
Presentation:       Variable
Placenta:           Right lateral
P. Cord Insertion:  Not well visualized

Amniotic Fluid
AFI FV:      Subjectively within normal limits
Biometry

CRL:      66.6  mm     G. Age:  12w 6d                  EDD:   11/25/15
Gestational Age

LMP:           12w 6d       Date:   02/18/15                 EDD:   11/25/15
Best:          12w 6d    Det. By:   LMP  (02/18/15)          EDD:   11/25/15
1st Trimester Genetic Sonogram Screening

CRL:            66.6  mm    G. Age:   12w 6d                 EDD:   11/25/15
Nuc Trans:       2.2  mm
Nasal Bone:                 Present
Anatomy

Choroid Plexus:        Appears normal         Upper Extremities:      Visualized
Stomach:               Appears normal, left   Lower Extremities:      Visualized
sided
Bladder:               Appears normal
Cervix Uterus Adnexa

Cervix
Normal appearance by transabdominal scan.

Uterus
No abnormality visualized.

Left Ovary
Size(cm)     3.68  x    2.43   x  1.43      Vol(ml):
Within normal limits. No adnexal mass visualized.

Right Ovary
Size(cm)       3.7 x    3.21   x  2.36      Vol(ml):
Within normal limits. No adnexal mass visualized.

Cul De Sac:   No free fluid seen.

Adnexa:       No abnormality visualized.
Impression

Single IUP at 12w 6d
Normal NT (2.2 mm) Nasal bone visualized
First trimester aneuploidy screen performed as noted above.
Please do not draw triple/quad screen, though patient should
be offered MSAFP for neural tube defect screening.
Recommendations

Recommend ultrasound for fetal anatomy at 18-20 weeks

## 2019-04-12 ENCOUNTER — Emergency Department (HOSPITAL_COMMUNITY): Payer: Medicaid Other

## 2019-04-12 ENCOUNTER — Encounter (HOSPITAL_COMMUNITY): Payer: Self-pay | Admitting: Emergency Medicine

## 2019-04-12 ENCOUNTER — Other Ambulatory Visit: Payer: Self-pay

## 2019-04-12 ENCOUNTER — Emergency Department (HOSPITAL_COMMUNITY)
Admission: EM | Admit: 2019-04-12 | Discharge: 2019-04-12 | Disposition: A | Discharge status: Home / Self Care | Payer: Medicaid Other | Attending: Emergency Medicine | Admitting: Emergency Medicine

## 2019-04-12 DIAGNOSIS — N3001 Acute cystitis with hematuria: Secondary | ICD-10-CM | POA: Insufficient documentation

## 2019-04-12 DIAGNOSIS — R000 Tachycardia, unspecified: Secondary | ICD-10-CM | POA: Insufficient documentation

## 2019-04-12 DIAGNOSIS — R109 Unspecified abdominal pain: Secondary | ICD-10-CM | POA: Diagnosis present

## 2019-04-12 DIAGNOSIS — N390 Urinary tract infection, site not specified: Secondary | ICD-10-CM

## 2019-04-12 LAB — COMPREHENSIVE METABOLIC PANEL
ALT: 19 U/L (ref 0–44)
AST: 20 U/L (ref 15–41)
Albumin: 3.9 g/dL (ref 3.5–5.0)
Alkaline Phosphatase: 42 U/L (ref 38–126)
Anion gap: 12 (ref 5–15)
BUN: 12 mg/dL (ref 6–20)
CO2: 24 mmol/L (ref 22–32)
Calcium: 9.3 mg/dL (ref 8.9–10.3)
Chloride: 102 mmol/L (ref 98–111)
Creatinine, Ser: 0.78 mg/dL (ref 0.44–1.00)
GFR calc Af Amer: >60 mL/min (ref >60)
GFR calc non Af Amer: >60 mL/min (ref >60)
Glucose, Bld: 105 mg/dL — ABNORMAL HIGH (ref 70–99)
Potassium: 4 mmol/L (ref 3.5–5.1)
Sodium: 138 mmol/L (ref 135–145)
Total Bilirubin: 0.6 mg/dL (ref 0.3–1.2)
Total Protein: 7.4 g/dL (ref 6.5–8.1)

## 2019-04-12 LAB — URINALYSIS, ROUTINE W REFLEX MICROSCOPIC
Bilirubin Urine: NEGATIVE
Glucose, UA: NEGATIVE mg/dL
Ketones, ur: NEGATIVE mg/dL
Nitrite: NEGATIVE
Protein, ur: 100 mg/dL — AB
RBC / HPF: >50 RBC/hpf — ABNORMAL HIGH (ref 0–5)
Specific Gravity, Urine: 1.008 (ref 1.005–1.030)
WBC, UA: >50 WBC/hpf — ABNORMAL HIGH (ref 0–5)
pH: 7 (ref 5.0–8.0)

## 2019-04-12 LAB — I-STAT BETA HCG BLOOD, ED (MC, WL, AP ONLY): I-stat hCG, quantitative: <5 m[IU]/mL (ref <5)

## 2019-04-12 LAB — CBC
HCT: 37.5 % (ref 36.0–46.0)
Hemoglobin: 12.6 g/dL (ref 12.0–15.0)
MCH: 29.8 pg (ref 26.0–34.0)
MCHC: 33.6 g/dL (ref 30.0–36.0)
MCV: 88.7 fL (ref 80.0–100.0)
Platelets: 203 10*3/uL (ref 150–400)
RBC: 4.23 MIL/uL (ref 3.87–5.11)
RDW: 12.2 % (ref 11.5–15.5)
WBC: 12.3 10*3/uL — ABNORMAL HIGH (ref 4.0–10.5)
nRBC: 0 % (ref 0.0–0.2)

## 2019-04-12 LAB — LACTIC ACID, PLASMA: Lactic Acid, Venous: 1.4 mmol/L (ref 0.5–1.9)

## 2019-04-12 LAB — LIPASE, BLOOD: Lipase: 29 U/L (ref 11–51)

## 2019-04-12 MED ORDER — CEPHALEXIN 500 MG PO CAPS: 500.0000 mg | ORAL_CAPSULE | Freq: Two times a day (BID) | ORAL | 0 refills | Status: AC

## 2019-04-12 MED ORDER — SODIUM CHLORIDE 0.9 % IV SOLN
1.0000 g | Freq: Once | INTRAVENOUS | Status: AC
  Administered 2019-04-12: 1 g via INTRAVENOUS
  Filled 2019-04-12: qty 10

## 2019-04-12 MED ORDER — SODIUM CHLORIDE 0.9 % IV BOLUS
500.0000 mL | Freq: Once | INTRAVENOUS | Status: AC
  Administered 2019-04-12: 500 mL via INTRAVENOUS

## 2019-04-12 MED ORDER — KETOROLAC TROMETHAMINE 30 MG/ML IJ SOLN
15.0000 mg | Freq: Once | INTRAMUSCULAR | Status: AC
  Administered 2019-04-12: 15 mg via INTRAVENOUS
  Filled 2019-04-12: qty 1

## 2019-04-12 MED ORDER — MORPHINE SULFATE (PF) 4 MG/ML IV SOLN
4.0000 mg | Freq: Once | INTRAVENOUS | Status: AC
  Administered 2019-04-12: 4 mg via INTRAVENOUS
  Filled 2019-04-12: qty 1

## 2019-04-12 MED ORDER — ONDANSETRON HCL 4 MG/2ML IJ SOLN
4.0000 mg | Freq: Once | INTRAMUSCULAR | Status: AC
  Administered 2019-04-12: 4 mg via INTRAVENOUS
  Filled 2019-04-12: qty 2

## 2019-04-12 MED ORDER — ONDANSETRON 4 MG PO TBDP: 4.0000 mg | ORAL_TABLET | Freq: Three times a day (TID) | ORAL | 0 refills | Status: DC | PRN

## 2019-04-12 MED ORDER — ACETAMINOPHEN 500 MG PO TABS
1000.0000 mg | ORAL_TABLET | Freq: Once | ORAL | Status: AC
  Administered 2019-04-12: 1000 mg via ORAL
  Filled 2019-04-12: qty 2

## 2019-04-12 MED ORDER — SODIUM CHLORIDE 0.9% FLUSH
3.0000 mL | Freq: Once | INTRAVENOUS | Status: AC
  Administered 2019-04-12: 3 mL via INTRAVENOUS

## 2019-04-12 NOTE — ED Notes (Signed)
Pt transported to CT ?

## 2019-04-12 NOTE — ED Triage Notes (Signed)
Patient reports low abdominal pain and low back pain with dysuria onset last week , denies emesis or diarrhea , no fever or chills .

## 2019-04-12 NOTE — Discharge Instructions (Addendum)
Please take cephalexin, 500 mg by mouth twice a day for the next 10 days.  This will treat the likely kidney infection that you have.  If you should develop increasing pain, vomiting, fever lightheadedness or passing out you should return to the emergency department immediately.  Zofran as needed every 6 hours for nausea  Drink plenty of fluids  Rest at home for the next 2 days  Make a follow-up with your family doctor to follow-up in the next 5 days

## 2019-04-12 NOTE — ED Notes (Signed)
Patient given discharge instructions patient verbalizes understanding. 

## 2019-04-12 NOTE — ED Provider Notes (Addendum)
I have reevaluated this patient at 8:50 AM, she is doing well, she is less nauseated, she is no longer tachycardic on my exam has normal pulses at the radial arteries and a soft nontender abdomen and has tolerated oral fluids.  She is ambulated, has received IV Rocephin and IV fluids and blood pressure has improved after getting the morphine which made her blood pressure drop.  She is feeling safe for discharge, her CT scan did not reveal any significant abnormal pathology, I suspect that she does have early pyelonephritis, she is agreeable to discharge to return should symptoms worsen.   Lactic acid negative  Eber Hong, MD 04/12/19 0233    Eber Hong, MD 04/12/19 [phone]

## 2019-04-12 NOTE — ED Provider Notes (Signed)
[facility] EMERGENCY DEPARTMENT Provider Note   CSN: [id number] Arrival date & time: 04/12/19  0137     History Chief Complaint  Patient presents with  . Abdominal Pain    Kathleen Bell is a 24 y.o. female.  Patient presents to the emergency department for evaluation of abdominal pain.  Patient experiencing mostly right-sided lower abdominal and back pain associated with urinary frequency and pain with urination.  Symptoms present for nearly a week.  She has not had any fever or vomiting.        Past Medical History:  Diagnosis Date  . Fainting   . Medical history non-contributory     Patient Active Problem List   Diagnosis Date Noted  . Normal labor 11/18/2015  . Abnormal maternal serum screening test 06/22/2015  . [redacted] weeks gestation of pregnancy     Past Surgical History:  Procedure Laterality Date  . DILATION AND CURETTAGE OF UTERUS     for retained placenta  . NO PAST SURGERIES       OB History    Gravida  2   Para  2   Term  2   Preterm      AB      Living  2     SAB      TAB      Ectopic      Multiple  0   Live Births  1           No family history on file.  Social History   Tobacco Use  . Smoking status: Never Smoker  . Smokeless tobacco: Never Used  Substance Use Topics  . Alcohol use: No  . Drug use: No    Home Medications Prior to Admission medications   Medication Sig Start Date End Date Taking? Authorizing Provider  cyclobenzaprine (FLEXERIL) 5 MG tablet Take 1 tablet (5 mg total) by mouth 3 (three) times daily as needed for muscle spasms. Patient not taking: Reported on 04/12/2019 05/30/17   Burgess Amor, PA-C  ibuprofen (ADVIL,MOTRIN) 600 MG tablet Take 1 tablet (600 mg total) by mouth every 6 (six) hours as needed. Patient not taking: Reported on 04/12/2019 05/30/17   Burgess Amor, PA-C    Allergies    Latex  Review of Systems   Review of Systems  Genitourinary: Positive for dysuria and flank  pain.  All other systems reviewed and are negative.   Physical Exam Updated Vital Signs BP (!) 78/48 Comment: Md Joaovictor Krone aware; pt will be given another 500cc bolus  Pulse (!) 109 Comment: Md Mikayela Deats aware; pt will be given another 500cc bolus  Temp 100.3 F (37.9 C)   Resp 20   Ht 5\' 1"  (1.549 m)   Wt 65 kg   LMP 03/20/2019   SpO2 98% Comment: Md Tinzley Dalia aware; pt will be given another 500cc bolus  BMI 27.08 kg/m   Physical Exam Vitals and nursing note reviewed.  Constitutional:      General: She is not in acute distress.    Appearance: Normal appearance. She is well-developed.  HENT:     Head: Normocephalic and atraumatic.     Right Ear: Hearing normal.     Left Ear: Hearing normal.     Nose: Nose normal.  Eyes:     Conjunctiva/sclera: Conjunctivae normal.     Pupils: Pupils are equal, round, and reactive to light.  Cardiovascular:     Rate and Rhythm: Regular rhythm.     Heart  sounds: S1 normal and S2 normal. No murmur. No friction rub. No gallop.   Pulmonary:     Effort: Pulmonary effort is normal. No respiratory distress.     Breath sounds: Normal breath sounds.  Chest:     Chest wall: No tenderness.  Abdominal:     General: Bowel sounds are normal.     Palpations: Abdomen is soft.     Tenderness: There is abdominal tenderness in the right lower quadrant and suprapubic area. There is right CVA tenderness. There is no guarding or rebound. Negative signs include Murphy's sign and McBurney's sign.     Hernia: No hernia is present.  Musculoskeletal:        General: Normal range of motion.     Cervical back: Normal range of motion and neck supple.  Skin:    General: Skin is warm and dry.     Findings: No rash.  Neurological:     Mental Status: She is alert and oriented to person, place, and time.     GCS: GCS eye subscore is 4. GCS verbal subscore is 5. GCS motor subscore is 6.     Cranial Nerves: No cranial nerve deficit.     Sensory: No sensory deficit.      Coordination: Coordination normal.  Psychiatric:        Speech: Speech normal.        Behavior: Behavior normal.        Thought Content: Thought content normal.     ED Results / Procedures / Treatments   Labs (all labs ordered are listed, but only abnormal results are displayed) Labs Reviewed  COMPREHENSIVE METABOLIC PANEL - Abnormal; Notable for the following components:      Result Value   Glucose, Bld 105 (*)    All other components within normal limits  CBC - Abnormal; Notable for the following components:   WBC 12.3 (*)    All other components within normal limits  URINALYSIS, ROUTINE W REFLEX MICROSCOPIC - Abnormal; Notable for the following components:   APPearance CLOUDY (*)    Hgb urine dipstick LARGE (*)    Protein, ur 100 (*)    Leukocytes,Ua LARGE (*)    RBC / HPF >50 (*)    WBC, UA >50 (*)    Bacteria, UA RARE (*)    Non Squamous Epithelial 0-5 (*)    All other components within normal limits  URINE CULTURE  LIPASE, BLOOD  LACTIC ACID, PLASMA  I-STAT BETA HCG BLOOD, ED ([facility], [facility], [facility] ONLY)    EKG None  Radiology CT RENAL STONE STUDY  Result Date: 04/12/2019 CLINICAL DATA:  Lower abdominal pain and back pain EXAM: CT ABDOMEN AND PELVIS WITHOUT CONTRAST TECHNIQUE: Multidetector CT imaging of the abdomen and pelvis was performed following the standard protocol without IV contrast. COMPARISON:  None. FINDINGS: Lower chest: The visualized heart size within normal limits. No pericardial fluid/thickening. No hiatal hernia. The visualized portions of the lungs are clear. Hepatobiliary: Although limited due to the lack of intravenous contrast, normal in appearance without gross focal abnormality. No evidence of calcified gallstones or biliary ductal dilatation. Pancreas:  Unremarkable.  No surrounding inflammatory changes. Spleen: Normal in size. Although limited due to the lack of intravenous contrast, normal in appearance. Adrenals/Urinary Tract: Both adrenal glands appear  normal. The kidneys and collecting system appear normal without evidence of urinary tract calculus or hydronephrosis. There appears to be mild diffuse bladder wall thickening. Stomach/Bowel: The stomach, small bowel, and colon are  normal in appearance. No inflammatory changes or obstructive findings. appendix is normal. Vascular/Lymphatic: There are no enlarged abdominal or pelvic lymph nodes. No significant gross vascular findings are present. Reproductive: The uterus and adnexa are unremarkable. Other: No evidence of abdominal wall mass or hernia. Musculoskeletal: No acute or significant osseous findings. IMPRESSION: No renal or collecting system calculi.  No hydronephrosis. Mild diffuse bladder wall thickening which could be due to cystitis. Electronically Signed   By: Prudencio Pair M.D.   On: 04/12/2019 06:04    Procedures Procedures (including critical care time)  Medications Ordered in ED Medications  sodium chloride flush (NS) 0.9 % injection 3 mL (3 mLs Intravenous Given 04/12/19 0514)  cefTRIAXone (ROCEPHIN) 1 g in sodium chloride 0.9 % 100 mL IVPB (0 g Intravenous Stopped 04/12/19 0556)  morphine 4 MG/ML injection 4 mg (4 mg Intravenous Given 04/12/19 0516)  ondansetron (ZOFRAN) injection 4 mg (4 mg Intravenous Given 04/12/19 0515)  sodium chloride 0.9 % bolus 500 mL (0 mLs Intravenous Stopped 04/12/19 0556)  acetaminophen (TYLENOL) tablet 1,000 mg (1,000 mg Oral Given 04/12/19 9373)  ketorolac (TORADOL) 30 MG/ML injection 15 mg (15 mg Intravenous Given 04/12/19 0633)  sodium chloride 0.9 % bolus 500 mL (500 mLs Intravenous New Bag/Given 04/12/19 4287)    ED Course  I have reviewed the triage vital signs and the nursing notes.  Pertinent labs & imaging results that were available during my care of the patient were reviewed by me and considered in my medical decision making (see chart for details).    MDM Rules/Calculators/A&P                      Patient presents to the emergency  department for evaluation of low abdominal pain and back pain.  This has been associated with urinary frequency and dysuria.  Examination revealed mainly suprapubic tenderness.  Urinalysis shows obvious infection.  Patient noted to have a low-grade fever at arrival.  She was mildly tachycardic.  The tachycardia was felt to be likely secondary to the pain she was experiencing.  She appears well otherwise.  No tachypnea, normotensive.  Patient administered IV fluids and underwent CT scan to further evaluate for possible concomitant ureteral stone with urinary tract infection.  CT scan shows bladder wall thickening but no other findings.  Appendix is normal.  Patient was given morphine for her pain, at which point she briefly became hypotensive.  Hypotension is felt to be secondary to the morphine effect, not consistent with shock.  This improved with a small fluid bolus.  We will add on lactic acid to ensure that this is not elevated.  Administer antipyretics and reevaluate. Will sign out to oncoming ER physician to monitor and disposition.  Final Clinical Impression(s) / ED Diagnoses Final diagnoses:  Urinary tract infection with hematuria, site unspecified    Rx / DC Orders ED Discharge Orders    None       Rehman Levinson, Gwenyth Allegra, MD 04/12/19 [phone]

## 2019-04-13 LAB — URINE CULTURE: Culture: >=100000 — AB

## 2019-04-14 ENCOUNTER — Telehealth: Payer: Self-pay | Admitting: *Deleted

## 2019-04-14 NOTE — Telephone Encounter (Signed)
Post ED Visit - Positive Culture Follow-up  Culture report reviewed by antimicrobial stewardship pharmacist: [facility] []  , Pharm.D. []  Enzo Bi, Pharm.D., BCPS AQ-ID []  , Pharm.D., BCPS []  Celedonio Miyamoto, Pharm.D., BCPS []  [place], Garvin Fila.D., BCPS, AAHIVP []  , Pharm.D., BCPS, AAHIVP []  Georgina Pillion, PharmD, BCPS []  , PharmD, BCPS []  [place], PharmD, BCPS []  [address], PharmD []  , PharmD, BCPS []  Estella Husk, PharmD , PharmD  [facility] []  , PharmD []  Phillips Climes, PharmD []  , PharmD []  Agapito Games, Rph []  ) Verlan Friends, PharmD []  , PharmD []  Mervyn Gay, PharmD []  , PharmD []  Vinnie Level, PharmD []  Malachi Bonds, PharmD []  [facility], PharmD []  , PharmD []  Len Childs, PharmD   Positive urine culture Treated with Cephalexin, organism sensitive to the same and no further patient follow-up is required at this time.  [facility] 04/14/2019, 11:29 AM

## 2019-07-22 DIAGNOSIS — Z419 Encounter for procedure for purposes other than remedying health state, unspecified: Secondary | ICD-10-CM | POA: Diagnosis not present

## 2019-08-22 DIAGNOSIS — Z419 Encounter for procedure for purposes other than remedying health state, unspecified: Secondary | ICD-10-CM | POA: Diagnosis not present

## 2019-08-22 DIAGNOSIS — R519 Headache, unspecified: Secondary | ICD-10-CM | POA: Diagnosis not present

## 2019-08-22 DIAGNOSIS — R110 Nausea: Secondary | ICD-10-CM | POA: Diagnosis not present

## 2019-08-22 DIAGNOSIS — S161XXA Strain of muscle, fascia and tendon at neck level, initial encounter: Secondary | ICD-10-CM | POA: Diagnosis not present

## 2019-08-22 DIAGNOSIS — S0990XA Unspecified injury of head, initial encounter: Secondary | ICD-10-CM | POA: Diagnosis not present

## 2019-08-22 DIAGNOSIS — Z3202 Encounter for pregnancy test, result negative: Secondary | ICD-10-CM | POA: Diagnosis not present

## 2019-08-22 DIAGNOSIS — M549 Dorsalgia, unspecified: Secondary | ICD-10-CM | POA: Diagnosis not present

## 2019-08-22 DIAGNOSIS — R55 Syncope and collapse: Secondary | ICD-10-CM | POA: Diagnosis not present

## 2019-10-22 DIAGNOSIS — Z419 Encounter for procedure for purposes other than remedying health state, unspecified: Secondary | ICD-10-CM | POA: Diagnosis not present

## 2019-11-22 DIAGNOSIS — Z419 Encounter for procedure for purposes other than remedying health state, unspecified: Secondary | ICD-10-CM | POA: Diagnosis not present

## 2019-12-03 ENCOUNTER — Other Ambulatory Visit: Payer: Self-pay

## 2019-12-03 ENCOUNTER — Ambulatory Visit (HOSPITAL_COMMUNITY)
Admission: EM | Admit: 2019-12-03 | Discharge: 2019-12-03 | Disposition: A | Discharge status: Home / Self Care | Payer: Medicaid Other | Attending: Family Medicine | Admitting: Family Medicine

## 2019-12-03 ENCOUNTER — Encounter (HOSPITAL_COMMUNITY): Payer: Self-pay | Admitting: Emergency Medicine

## 2019-12-03 DIAGNOSIS — N939 Abnormal uterine and vaginal bleeding, unspecified: Secondary | ICD-10-CM | POA: Insufficient documentation

## 2019-12-03 DIAGNOSIS — Z3202 Encounter for pregnancy test, result negative: Secondary | ICD-10-CM | POA: Diagnosis not present

## 2019-12-03 LAB — POC URINE PREG, ED: Preg Test, Ur: NEGATIVE

## 2019-12-03 LAB — POCT URINALYSIS DIPSTICK, ED / UC
Glucose, UA: NEGATIVE mg/dL
Ketones, ur: 15 mg/dL — AB
Nitrite: NEGATIVE
Protein, ur: 30 mg/dL — AB
Specific Gravity, Urine: 1.02 (ref 1.005–1.030)
Urobilinogen, UA: 1 mg/dL (ref 0.0–1.0)
pH: 6 (ref 5.0–8.0)

## 2019-12-03 NOTE — ED Triage Notes (Addendum)
Pt c/o possible pregnancy. She states lmp was September. She states she has a birth control implant and she was bleeding a lot on Sunday.  She is concerned she may have had a miscarriage. She had a positive at home pregnancy test on Sunday.

## 2019-12-03 NOTE — Discharge Instructions (Signed)
Recommend to follow up with your doctor or go to the women's health center.

## 2019-12-04 LAB — URINE CULTURE: Culture: <10000 — AB

## 2019-12-04 NOTE — ED Provider Notes (Signed)
[facility]    CSN: [id number] Arrival date & time: 12/03/19  3536      History   Chief Complaint Chief Complaint  Patient presents with  . Possible Pregnancy    HPI Kathleen Bell is a 24 y.o. female.   Patient is a 24 year old female who presents today for possible pregnancy and vaginal bleeding.  Reporting that her last menstrual period was 10/06/2019.  She is currently on Nexplanon for birth control.  Reporting Sunday had excessive amount of bleeding that has subsided.  Was concern for miscarriage.  Reporting 3+ pregnancy test at home.  Mild abdominal discomfort and cramping.  No dysuria, hematuria or urinary frequency.  No abnormal discharge or odor.  Reporting became pregnant with her first child while she had the IUD.     Past Medical History:  Diagnosis Date  . Fainting   . Medical history non-contributory     Patient Active Problem List   Diagnosis Date Noted  . Normal labor 11/18/2015  . Abnormal maternal serum screening test 06/22/2015  . [redacted] weeks gestation of pregnancy     Past Surgical History:  Procedure Laterality Date  . DILATION AND CURETTAGE OF UTERUS     for retained placenta  . NO PAST SURGERIES      OB History    Gravida  2   Para  2   Term  2   Preterm      AB      Living  2     SAB      TAB      Ectopic      Multiple  0   Live Births  1            Home Medications    Prior to Admission medications   Not on File    Family History History reviewed. No pertinent family history.  Social History Social History   Tobacco Use  . Smoking status: Never Smoker  . Smokeless tobacco: Never Used  Vaping Use  . Vaping Use: Never used  Substance Use Topics  . Alcohol use: No  . Drug use: No     Allergies   Latex   Review of Systems Review of Systems   Physical Exam Triage Vital Signs ED Triage Vitals [12/03/19 0849]  Enc Vitals Group     BP      Pulse      Resp      Temp      Temp src       SpO2      Weight      Height      Head Circumference      Peak Flow      Pain Score 7     Pain Loc      Pain Edu?      Excl. in GC?    No data found.  Updated Vital Signs LMP 10/06/2019   Visual Acuity Right Eye Distance:   Left Eye Distance:   Bilateral Distance:    Right Eye Near:   Left Eye Near:    Bilateral Near:     Physical Exam Vitals and nursing note reviewed.  Constitutional:      General: She is not in acute distress.    Appearance: Normal appearance. She is not ill-appearing, toxic-appearing or diaphoretic.     Comments: Tearful   HENT:     Head: Normocephalic.     Nose: Nose normal.  Eyes:  Conjunctiva/sclera: Conjunctivae normal.  Pulmonary:     Effort: Pulmonary effort is normal.  Abdominal:     Palpations: Abdomen is soft.     Tenderness: There is no abdominal tenderness.  Musculoskeletal:        General: Normal range of motion.     Cervical back: Normal range of motion.  Skin:    General: Skin is warm and dry.     Findings: No rash.  Neurological:     Mental Status: She is alert.  Psychiatric:        Mood and Affect: Mood normal.      UC Treatments / Results  Labs (all labs ordered are listed, but only abnormal results are displayed) Labs Reviewed  URINE CULTURE - Abnormal; Notable for the following components:      Result Value   Culture   (*)    Value: <10,000 COLONIES/mL INSIGNIFICANT GROWTH Performed at [facility], [address], [place], [place] [address]    All other components within normal limits  POCT URINALYSIS DIPSTICK, ED / UC - Abnormal; Notable for the following components:   Bilirubin Urine SMALL (*)    Ketones, ur 15 (*)    Hgb urine dipstick TRACE (*)    Protein, ur 30 (*)    Leukocytes,Ua SMALL (*)    All other components within normal limits  POC URINE PREG, ED    EKG   Radiology No results found.  Procedures Procedures (including critical care time)  Medications Ordered in  UC Medications - No data to display  Initial Impression / Assessment and Plan / UC Course  I have reviewed the triage vital signs and the nursing notes.  Pertinent labs & imaging results that were available during my care of the patient were reviewed by me and considered in my medical decision making (see chart for details).     Negative pregnancy test and vaginal bleeding Patient had negative pregnancy test here in clinic today.  Patient is convinced that she is pregnant.  Reporting she has become pregnant before while on birth control.  She had 3+ tests at home. Could be heavy menstrual period versus miscarriage.  Recommended follow-up with OB/GYN next week for recheck and possible ultrasound.  If her symptoms worsen to include severe abdominal pain, bleeding she will need to go to the women's center  Final Clinical Impressions(s) / UC Diagnoses   Final diagnoses:  Negative pregnancy test  Vaginal bleeding     Discharge Instructions     Recommend to follow up with your doctor or go to the women's health center.     ED Prescriptions    None     PDMP not reviewed this encounter.   Janace Aris, NP 12/04/19 1335

## 2019-12-22 DIAGNOSIS — Z419 Encounter for procedure for purposes other than remedying health state, unspecified: Secondary | ICD-10-CM | POA: Diagnosis not present

## 2019-12-28 DIAGNOSIS — M25512 Pain in left shoulder: Secondary | ICD-10-CM | POA: Diagnosis not present

## 2019-12-28 DIAGNOSIS — M542 Cervicalgia: Secondary | ICD-10-CM | POA: Diagnosis not present

## 2019-12-30 DIAGNOSIS — S134XXD Sprain of ligaments of cervical spine, subsequent encounter: Secondary | ICD-10-CM | POA: Diagnosis not present

## 2019-12-30 DIAGNOSIS — M5412 Radiculopathy, cervical region: Secondary | ICD-10-CM | POA: Diagnosis not present

## 2020-01-03 DIAGNOSIS — S134XXD Sprain of ligaments of cervical spine, subsequent encounter: Secondary | ICD-10-CM | POA: Diagnosis not present

## 2020-01-03 DIAGNOSIS — R200 Anesthesia of skin: Secondary | ICD-10-CM | POA: Diagnosis not present

## 2020-01-05 DIAGNOSIS — M5412 Radiculopathy, cervical region: Secondary | ICD-10-CM | POA: Diagnosis not present

## 2020-01-08 ENCOUNTER — Encounter (HOSPITAL_COMMUNITY): Payer: Self-pay

## 2020-01-08 ENCOUNTER — Other Ambulatory Visit: Payer: Self-pay

## 2020-01-08 ENCOUNTER — Ambulatory Visit (HOSPITAL_COMMUNITY)
Admission: EM | Admit: 2020-01-08 | Discharge: 2020-01-08 | Disposition: A | Discharge status: Home / Self Care | Payer: Medicaid Other | Attending: Emergency Medicine | Admitting: Emergency Medicine

## 2020-01-08 DIAGNOSIS — H00014 Hordeolum externum left upper eyelid: Secondary | ICD-10-CM | POA: Diagnosis not present

## 2020-01-08 MED ORDER — POLYMYXIN B-TRIMETHOPRIM 10000-0.1 UNIT/ML-% OP SOLN: 1.0000 [drp] | Freq: Four times a day (QID) | OPHTHALMIC | 0 refills | Status: AC

## 2020-01-08 NOTE — ED Triage Notes (Signed)
Pt reports having a bump in the upper eyelid x 1 month. Denies pain, drainage.

## 2020-01-08 NOTE — Discharge Instructions (Addendum)
Use the antibiotic eyedrops as prescribed.    Follow-up with your eye doctor for a recheck in 1 to 2 days if your symptoms are not improving.    Go to the emergency department if you have acute eye pain, changes in your vision, or other concerning symptoms.    

## 2020-01-08 NOTE — ED Provider Notes (Signed)
[facility]    CSN: [id number] Arrival date & time: 01/08/20  1408      History   Chief Complaint Chief Complaint  Patient presents with   Eye Problem    HPI Kathleen Bell is a 24 y.o. female.   Patient presents with a "bump" on her left upper eyelid x1 month.  She denies eye pain, changes in her vision, drainage, fever, chills, or other symptoms.  No treatments attempted at home.  She denies pertinent medical history.  The history is provided by the patient and medical records.    Past Medical History:  Diagnosis Date   Fainting    Medical history non-contributory     Patient Active Problem List   Diagnosis Date Noted   Normal labor 11/18/2015   Abnormal maternal serum screening test 06/22/2015   [redacted] weeks gestation of pregnancy     Past Surgical History:  Procedure Laterality Date   DILATION AND CURETTAGE OF UTERUS     for retained placenta   NO PAST SURGERIES      OB History    Gravida  2   Para  2   Term  2   Preterm      AB      Living  2     SAB      IAB      Ectopic      Multiple  0   Live Births  1            Home Medications    Prior to Admission medications   Medication Sig Start Date End Date Taking? Authorizing Provider  trimethoprim-polymyxin b (POLYTRIM) ophthalmic solution Place 1 drop into both eyes 4 (four) times daily for 7 days. 01/08/20 01/15/20  Mickie Bail, NP    Family History History reviewed. No pertinent family history.  Social History Social History   Tobacco Use   Smoking status: Never Smoker   Smokeless tobacco: Never Used  Building services engineer Use: Never used  Substance Use Topics   Alcohol use: No   Drug use: No     Allergies   Latex   Review of Systems Review of Systems  Constitutional: Negative for chills and fever.  HENT: Negative for ear pain and sore throat.   Eyes: Negative for pain, discharge, redness, itching and visual disturbance.       "bump" on  left upper eyelid  Respiratory: Negative for cough and shortness of breath.   Cardiovascular: Negative for chest pain and palpitations.  Gastrointestinal: Negative for abdominal pain and vomiting.  Genitourinary: Negative for dysuria and hematuria.  Musculoskeletal: Negative for arthralgias and back pain.  Skin: Negative for color change and rash.  Neurological: Negative for seizures and syncope.  All other systems reviewed and are negative.    Physical Exam Triage Vital Signs ED Triage Vitals  Enc Vitals Group     BP      Pulse      Resp      Temp      Temp src      SpO2      Weight      Height      Head Circumference      Peak Flow      Pain Score      Pain Loc      Pain Edu?      Excl. in GC?    No data found.  Updated Vital Signs BP 118/81 (  BP Location: Right Arm)    Pulse 76    Temp 98.1 F (36.7 C) (Oral)    Resp 16    LMP 01/04/2020 (Exact Date)    SpO2 100%   Visual Acuity Right Eye Distance: 20/40 Left Eye Distance: 20/40 Bilateral Distance: 20/25  Right Eye Near:   Left Eye Near:    Bilateral Near:     Physical Exam Vitals and nursing note reviewed.  Constitutional:      General: She is not in acute distress.    Appearance: She is well-developed and well-nourished. She is not ill-appearing.  HENT:     Head: Normocephalic and atraumatic.     Mouth/Throat:     Mouth: Mucous membranes are moist.  Eyes:     Extraocular Movements: Extraocular movements intact.     Conjunctiva/sclera: Conjunctivae normal.     Pupils: Pupils are equal, round, and reactive to light.      Comments: Small papule on left upper eyelid. No drainage.   Cardiovascular:     Rate and Rhythm: Normal rate and regular rhythm.     Heart sounds: No murmur heard.   Pulmonary:     Effort: Pulmonary effort is normal. No respiratory distress.     Breath sounds: Normal breath sounds.  Abdominal:     Palpations: Abdomen is soft.     Tenderness: There is no abdominal tenderness.   Musculoskeletal:        General: No edema.     Cervical back: Neck supple.  Skin:    General: Skin is warm and dry.  Neurological:     General: No focal deficit present.     Mental Status: She is alert and oriented to person, place, and time.     Gait: Gait normal.  Psychiatric:        Mood and Affect: Mood and affect and mood normal.        Behavior: Behavior normal.      UC Treatments / Results  Labs (all labs ordered are listed, but only abnormal results are displayed) Labs Reviewed - No data to display  EKG   Radiology No results found.  Procedures Procedures (including critical care time)  Medications Ordered in UC Medications - No data to display  Initial Impression / Assessment and Plan / UC Course  I have reviewed the triage vital signs and the nursing notes.  Pertinent labs & imaging results that were available during my care of the patient were reviewed by me and considered in my medical decision making (see chart for details).   Left upper eyelid hordeolum.  Treating with Polytrim eyedrops.  Instructed patient to follow-up with her eye care provider if her symptoms are not improving.  Instructed her to go the ED if she has eye pain, changes in her vision, or other concerning symptoms.  She agrees to plan of care.      Final Clinical Impressions(s) / UC Diagnoses   Final diagnoses:  Hordeolum externum of left upper eyelid     Discharge Instructions     Use the antibiotic eyedrops as prescribed.    Follow-up with your eye doctor for a recheck in 1 to 2 days if your symptoms are not improving.    Go to the emergency department if you have acute eye pain, changes in your vision, or other concerning symptoms.       ED Prescriptions    Medication Sig Dispense Auth. Provider   trimethoprim-polymyxin b (POLYTRIM) ophthalmic solution Place  1 drop into both eyes 4 (four) times daily for 7 days. 10 mL Mickie Bail, NP     PDMP not reviewed this  encounter.   Mickie Bail, NP 01/08/20 1627

## 2020-01-10 DIAGNOSIS — M5412 Radiculopathy, cervical region: Secondary | ICD-10-CM | POA: Diagnosis not present

## 2020-01-12 DIAGNOSIS — M5412 Radiculopathy, cervical region: Secondary | ICD-10-CM | POA: Diagnosis not present

## 2020-01-18 DIAGNOSIS — M5412 Radiculopathy, cervical region: Secondary | ICD-10-CM | POA: Diagnosis not present

## 2020-01-20 DIAGNOSIS — M5412 Radiculopathy, cervical region: Secondary | ICD-10-CM | POA: Diagnosis not present

## 2020-01-22 DIAGNOSIS — Z419 Encounter for procedure for purposes other than remedying health state, unspecified: Secondary | ICD-10-CM | POA: Diagnosis not present

## 2020-01-27 DIAGNOSIS — S134XXD Sprain of ligaments of cervical spine, subsequent encounter: Secondary | ICD-10-CM | POA: Diagnosis not present

## 2020-01-27 DIAGNOSIS — M5412 Radiculopathy, cervical region: Secondary | ICD-10-CM | POA: Diagnosis not present

## 2020-02-01 DIAGNOSIS — M25512 Pain in left shoulder: Secondary | ICD-10-CM | POA: Diagnosis not present

## 2020-02-01 DIAGNOSIS — M7542 Impingement syndrome of left shoulder: Secondary | ICD-10-CM | POA: Diagnosis not present

## 2020-02-04 DIAGNOSIS — M5412 Radiculopathy, cervical region: Secondary | ICD-10-CM | POA: Diagnosis not present

## 2020-02-11 DIAGNOSIS — M5412 Radiculopathy, cervical region: Secondary | ICD-10-CM | POA: Diagnosis not present

## 2020-02-14 DIAGNOSIS — M5412 Radiculopathy, cervical region: Secondary | ICD-10-CM | POA: Diagnosis not present

## 2020-02-21 DIAGNOSIS — M5412 Radiculopathy, cervical region: Secondary | ICD-10-CM | POA: Diagnosis not present

## 2020-02-22 DIAGNOSIS — Z419 Encounter for procedure for purposes other than remedying health state, unspecified: Secondary | ICD-10-CM | POA: Diagnosis not present

## 2020-03-21 DIAGNOSIS — Z419 Encounter for procedure for purposes other than remedying health state, unspecified: Secondary | ICD-10-CM | POA: Diagnosis not present

## 2020-04-21 DIAGNOSIS — Z419 Encounter for procedure for purposes other than remedying health state, unspecified: Secondary | ICD-10-CM | POA: Diagnosis not present

## 2020-04-25 DIAGNOSIS — M546 Pain in thoracic spine: Secondary | ICD-10-CM | POA: Diagnosis not present

## 2020-04-25 DIAGNOSIS — M25512 Pain in left shoulder: Secondary | ICD-10-CM | POA: Diagnosis not present

## 2020-05-10 DIAGNOSIS — M5412 Radiculopathy, cervical region: Secondary | ICD-10-CM | POA: Diagnosis not present

## 2020-05-21 DIAGNOSIS — Z419 Encounter for procedure for purposes other than remedying health state, unspecified: Secondary | ICD-10-CM | POA: Diagnosis not present

## 2020-05-24 DIAGNOSIS — M5412 Radiculopathy, cervical region: Secondary | ICD-10-CM | POA: Diagnosis not present

## 2020-05-26 DIAGNOSIS — M5412 Radiculopathy, cervical region: Secondary | ICD-10-CM | POA: Diagnosis not present

## 2020-05-30 DIAGNOSIS — M5412 Radiculopathy, cervical region: Secondary | ICD-10-CM | POA: Diagnosis not present

## 2020-06-02 DIAGNOSIS — M5412 Radiculopathy, cervical region: Secondary | ICD-10-CM | POA: Diagnosis not present

## 2020-06-06 DIAGNOSIS — M5412 Radiculopathy, cervical region: Secondary | ICD-10-CM | POA: Diagnosis not present

## 2020-06-09 DIAGNOSIS — M5412 Radiculopathy, cervical region: Secondary | ICD-10-CM | POA: Diagnosis not present

## 2020-06-13 DIAGNOSIS — M5412 Radiculopathy, cervical region: Secondary | ICD-10-CM | POA: Diagnosis not present

## 2020-06-21 DIAGNOSIS — Z419 Encounter for procedure for purposes other than remedying health state, unspecified: Secondary | ICD-10-CM | POA: Diagnosis not present

## 2020-06-24 ENCOUNTER — Other Ambulatory Visit: Payer: Self-pay

## 2020-06-24 ENCOUNTER — Ambulatory Visit (HOSPITAL_COMMUNITY)
Admission: EM | Admit: 2020-06-24 | Discharge: 2020-06-24 | Disposition: A | Discharge status: Home / Self Care | Payer: Medicaid Other

## 2020-06-24 ENCOUNTER — Encounter (HOSPITAL_COMMUNITY): Payer: Self-pay | Admitting: Emergency Medicine

## 2020-06-24 DIAGNOSIS — L2082 Flexural eczema: Secondary | ICD-10-CM | POA: Diagnosis not present

## 2020-06-24 MED ORDER — PREDNISONE 20 MG PO TABS: 40.0000 mg | ORAL_TABLET | Freq: Every day | ORAL | 0 refills | Status: AC

## 2020-06-24 NOTE — ED Provider Notes (Signed)
[facility]    CSN: [id number] Arrival date & time: 06/24/20  1723      History   Chief Complaint Chief Complaint  Patient presents with  . Rash    HPI Kathleen Bell is a 25 y.o. female presenting for itchy rash x1 month. Denies shortness of breath.  Medical history allergies.  Has tried Benadryl, Flonase, Zyrtec with minimal improvement.  Describes rash as itchy and diffuse.  Denies facial, pharyngeal swelling, denies shortness of breath    HPI  Past Medical History:  Diagnosis Date  . Fainting   . Medical history non-contributory     Patient Active Problem List   Diagnosis Date Noted  . Normal labor 11/18/2015  . Abnormal maternal serum screening test 06/22/2015  . [redacted] weeks gestation of pregnancy     Past Surgical History:  Procedure Laterality Date  . DILATION AND CURETTAGE OF UTERUS     for retained placenta  . NO PAST SURGERIES      OB History    Gravida  2   Para  2   Term  2   Preterm      AB      Living  2     SAB      IAB      Ectopic      Multiple  0   Live Births  1            Home Medications    Prior to Admission medications   Medication Sig Start Date End Date Taking? Authorizing Provider  diphenhydrAMINE (BENADRYL) 25 MG tablet Take 25 mg by mouth every 6 (six) hours as needed.   Yes [provider]  fluticasone (FLONASE) 50 MCG/ACT nasal spray Place into both nostrils daily.   Yes [provider]  predniSONE (DELTASONE) 20 MG tablet Take 2 tablets (40 mg total) by mouth daily for 5 days. 06/24/20 06/29/20 Yes Rhys Martini, PA-C    Family History Family History  Problem Relation Age of Onset  . Healthy Mother   . Healthy Father     Social History Social History   Tobacco Use  . Smoking status: Never Smoker  . Smokeless tobacco: Never Used  Vaping Use  . Vaping Use: Never used  Substance Use Topics  . Alcohol use: No  . Drug use: No     Allergies   Latex   Review of  Systems Review of Systems  Skin: Positive for rash.  All other systems reviewed and are negative.    Physical Exam Triage Vital Signs ED Triage Vitals  Enc Vitals Group     BP 06/24/20 1843 107/74     Pulse Rate 06/24/20 1843 82     Resp 06/24/20 1843 18     Temp 06/24/20 1843 98.4 F (36.9 C)     Temp Source 06/24/20 1843 Oral     SpO2 06/24/20 1843 100 %     Weight --      Height --      Head Circumference --      Peak Flow --      Pain Score 06/24/20 1840 4     Pain Loc --      Pain Edu? --      Excl. in GC? --    No data found.  Updated Vital Signs BP 107/74 (BP Location: Right Arm)   Pulse 82   Temp 98.4 F (36.9 C) (Oral)   Resp 18  LMP 06/19/2020   SpO2 100%   Visual Acuity Right Eye Distance:   Left Eye Distance:   Bilateral Distance:    Right Eye Near:   Left Eye Near:    Bilateral Near:     Physical Exam Vitals reviewed.  Constitutional:      General: She is not in acute distress.    Appearance: Normal appearance. She is not ill-appearing or diaphoretic.  HENT:     Head: Normocephalic and atraumatic.     Comments: Diffuse erythematous rash in flexural creases.  Few excoriations.  Absolutely no face, lip, tongue, uvula swelling.  Oxygenating comfortably. Cardiovascular:     Rate and Rhythm: Normal rate and regular rhythm.     Heart sounds: Normal heart sounds.  Pulmonary:     Effort: Pulmonary effort is normal.     Breath sounds: Normal breath sounds.  Skin:    General: Skin is warm.  Neurological:     General: No focal deficit present.     Mental Status: She is alert and oriented to person, place, and time.  Psychiatric:        Mood and Affect: Mood normal.        Behavior: Behavior normal.        Thought Content: Thought content normal.        Judgment: Judgment normal.      UC Treatments / Results  Labs (all labs ordered are listed, but only abnormal results are displayed) Labs Reviewed - No data to  display  EKG   Radiology No results found.  Procedures Procedures (including critical care time)  Medications Ordered in UC Medications - No data to display  Initial Impression / Assessment and Plan / UC Course  I have reviewed the triage vital signs and the nursing notes.  Pertinent labs & imaging results that were available during my care of the patient were reviewed by me and considered in my medical decision making (see chart for details).     This patient is a 25 year old female presenting with eczema.  Afebrile, nontachycardic. No facial, pharyngeal, lip, tongue swelling.  Uvula midline.  Oxygenating well on room air without wheezes rhonchi or rales.  Prednisone sent as below.  She is not a diabetic.  Continue Benadryl for symptomatic relief.  ED return precaution discussed  Final Clinical Impressions(s) / UC Diagnoses   Final diagnoses:  Flexural eczema     Discharge Instructions     -Prednisone, 2 pills taken at the same time for 5 days in a row.  Try taking this earlier in the day as it can give you energy.  -Continue Benadryl for additional relief.    ED Prescriptions    Medication Sig Dispense Auth. Provider   predniSONE (DELTASONE) 20 MG tablet Take 2 tablets (40 mg total) by mouth daily for 5 days. 10 tablet Rhys Martini, PA-C     PDMP not reviewed this encounter.   Rhys Martini, PA-C 06/24/20 1921

## 2020-06-24 NOTE — ED Triage Notes (Signed)
Itchy rash for a month.  Generalized rash.  Has been taking flonase, zyrtec, benadryl.

## 2020-06-24 NOTE — Discharge Instructions (Addendum)
-  Prednisone, 2 pills taken at the same time for 5 days in a row.  Try taking this earlier in the day as it can give you energy.  -Continue Benadryl for additional relief.

## 2020-07-04 DIAGNOSIS — M5412 Radiculopathy, cervical region: Secondary | ICD-10-CM | POA: Diagnosis not present

## 2020-07-14 DIAGNOSIS — M5412 Radiculopathy, cervical region: Secondary | ICD-10-CM | POA: Diagnosis not present

## 2020-07-18 DIAGNOSIS — M5412 Radiculopathy, cervical region: Secondary | ICD-10-CM | POA: Diagnosis not present

## 2020-07-20 DIAGNOSIS — M5412 Radiculopathy, cervical region: Secondary | ICD-10-CM | POA: Diagnosis not present

## 2020-07-21 DIAGNOSIS — Z419 Encounter for procedure for purposes other than remedying health state, unspecified: Secondary | ICD-10-CM | POA: Diagnosis not present

## 2020-07-24 ENCOUNTER — Encounter (HOSPITAL_COMMUNITY): Payer: Self-pay | Admitting: *Deleted

## 2020-07-24 ENCOUNTER — Emergency Department (HOSPITAL_COMMUNITY)
Admission: EM | Admit: 2020-07-24 | Discharge: 2020-07-24 | Disposition: A | Discharge status: Home / Self Care | Payer: Medicaid Other | Attending: Emergency Medicine | Admitting: Emergency Medicine

## 2020-07-24 ENCOUNTER — Other Ambulatory Visit: Payer: Self-pay

## 2020-07-24 DIAGNOSIS — R059 Cough, unspecified: Secondary | ICD-10-CM | POA: Diagnosis present

## 2020-07-24 DIAGNOSIS — Z28310 Unvaccinated for covid-19: Secondary | ICD-10-CM | POA: Diagnosis not present

## 2020-07-24 DIAGNOSIS — U071 COVID-19: Secondary | ICD-10-CM | POA: Diagnosis not present

## 2020-07-24 DIAGNOSIS — B349 Viral infection, unspecified: Secondary | ICD-10-CM | POA: Diagnosis not present

## 2020-07-24 DIAGNOSIS — Z91040 Latex allergy status: Secondary | ICD-10-CM | POA: Insufficient documentation

## 2020-07-24 DIAGNOSIS — J029 Acute pharyngitis, unspecified: Secondary | ICD-10-CM

## 2020-07-24 LAB — RESP PANEL BY RT-PCR (FLU A&B, COVID) ARPGX2
Influenza A by PCR: NEGATIVE
Influenza B by PCR: NEGATIVE
SARS Coronavirus 2 by RT PCR: POSITIVE — AB

## 2020-07-24 LAB — GROUP A STREP BY PCR: Group A Strep by PCR: NOT DETECTED

## 2020-07-24 MED ORDER — ACETAMINOPHEN 500 MG PO TABS
1000.0000 mg | ORAL_TABLET | Freq: Once | ORAL | Status: AC
  Administered 2020-07-24: 1000 mg via ORAL
  Filled 2020-07-24: qty 2

## 2020-07-24 NOTE — ED Triage Notes (Signed)
C/o sore throat and cough with nasal congestion onset yest.

## 2020-07-24 NOTE — ED Provider Notes (Signed)
[facility] EMERGENCY DEPARTMENT Provider Note   CSN: [id number] Arrival date & time: 07/24/20  0417     History Chief Complaint  Patient presents with   Cough   Sore Throat    Kathleen Bell is a 25 y.o. female who presents for evaluation of cough, nasal congestion, sore throat, subjective fever chills that began yesterday.  She reports she has had headaches, nasal congestion, rhinorrhea.  Cough is productive of phlegm.  She states she is still able to swallow but it hurts.  At home, she is taking Tylenol, Benadryl as well as tea.  She states despite that, she still has symptoms.  She has not measured a fever but states at home, she has been intermittently hot and cold.  She does not have any history of COPD or asthma.  She has not been vaccinated for COVID.  The history is provided by the patient.      Past Medical History:  Diagnosis Date   Fainting    Medical history non-contributory     Patient Active Problem List   Diagnosis Date Noted   Normal labor 11/18/2015   Abnormal maternal serum screening test 06/22/2015   [redacted] weeks gestation of pregnancy     Past Surgical History:  Procedure Laterality Date   DILATION AND CURETTAGE OF UTERUS     for retained placenta   NO PAST SURGERIES       OB History     Gravida  2   Para  2   Term  2   Preterm      AB      Living  2      SAB      IAB      Ectopic      Multiple  0   Live Births  1           Family History  Problem Relation Age of Onset   Healthy Mother    Healthy Father     Social History   Tobacco Use   Smoking status: Never   Smokeless tobacco: Never  Vaping Use   Vaping Use: Never used  Substance Use Topics   Alcohol use: Yes   Drug use: No    Home Medications Prior to Admission medications   Medication Sig Start Date End Date Taking? Authorizing Provider  diphenhydrAMINE (BENADRYL) 25 MG tablet Take 25 mg by mouth every 6 (six) hours as needed.     [provider]  fluticasone (FLONASE) 50 MCG/ACT nasal spray Place into both nostrils daily.    [provider]    Allergies    Latex  Review of Systems   Review of Systems  Constitutional:  Positive for fever (subjective). Negative for chills.  HENT:  Positive for congestion and rhinorrhea. Negative for trouble swallowing.   Respiratory:  Positive for cough. Negative for shortness of breath.   Cardiovascular:  Negative for chest pain.  Gastrointestinal:  Negative for abdominal pain, nausea and vomiting.  Genitourinary:  Negative for hematuria.  Neurological:  Positive for headaches.  All other systems reviewed and are negative.  Physical Exam Updated Vital Signs BP 124/78   Pulse (!) 124   Temp 99.4 F (37.4 C) (Oral)   Resp 18   Ht 5\' 1"  (1.549 m)   Wt 60.8 kg   SpO2 98%   BMI 25.32 kg/m   Physical Exam Vitals and nursing note reviewed.  Constitutional:      Appearance: She is  well-developed.     Comments: Well appearing  HENT:     Head: Normocephalic and atraumatic.     Mouth/Throat:     Comments: Posterior oropharynx is erythematous.  Uvula is midline.  Airways patent, phonation is intact.  No peritonsillar swelling.  No exudates noted. Eyes:     General: No scleral icterus.       Right eye: No discharge.        Left eye: No discharge.     Conjunctiva/sclera: Conjunctivae normal.  Neck:     Comments: Moving neck without any difficulty.  Cardiovascular:     Rate and Rhythm: Tachycardia present.  Pulmonary:     Effort: Pulmonary effort is normal.     Comments: Lungs clear to auscultation bilaterally.  Symmetric chest rise.  No wheezing, rales, rhonchi. Able to speak in full sentences without any difficulty.  Skin:    General: Skin is warm and dry.  Neurological:     Mental Status: She is alert.  Psychiatric:        Speech: Speech normal.        Behavior: Behavior normal.    ED Results / Procedures / Treatments   Labs (all labs  ordered are listed, but only abnormal results are displayed) Labs Reviewed  RESP PANEL BY RT-PCR (FLU A&B, COVID) ARPGX2 - Abnormal; Notable for the following components:      Result Value   SARS Coronavirus 2 by RT PCR POSITIVE (*)    All other components within normal limits  GROUP A STREP BY PCR    EKG None  Radiology No results found.  Procedures Procedures   Medications Ordered in ED Medications  acetaminophen (TYLENOL) tablet 1,000 mg (1,000 mg Oral Given 07/24/20 1191)    ED Course  I have reviewed the triage vital signs and the nursing notes.  Pertinent labs & imaging results that were available during my care of the patient were reviewed by me and considered in my medical decision making (see chart for details).    MDM Rules/Calculators/A&P                          25 year old female who presents for evaluation of sore throat, nasal congestion, cough, rhinorrhea that began yesterday.  She reports subjective fever chills at home.  On initial arrival, she is well-appearing, low-grade temp of 99.3.  She is slightly tachycardic.  Vital signs stable.  On exam, posterior oropharynx is erythematous.  No exudates.  Uvula is midline.  Exam not concerning for Ludwig angina, peritonsillar abscess.  Consider pharyngitis.  Also consider COVID-19 given her consolation of symptoms.  Strep ordered at triage.  Will add additional COVID test.  Strep is negative.  Discussed results with patient.  At this time, patient is tolerating her secretions with no evidence of respiratory distress.  Patient is well-appearing on exam.  Patient discussed that she has a COVID test pending.  If positive, we discussed at home supportive care measures. At this time, patient exhibits no emergent life-threatening condition that require further evaluation in ED. Patient had ample opportunity for questions and discussion. All patient's questions were answered with full understanding. Strict return precautions  discussed. Patient expresses understanding and agreement to plan.   9:42 AM: Patient's COVID test is positive. I called patient and updated her on results.   Kathleen Bell was evaluated in Emergency Department on 07/24/2020 for the symptoms described in the history of present illness. She was evaluated  in the context of the global COVID-19 pandemic, which necessitated consideration that the patient might be at risk for infection with the SARS-CoV-2 virus that causes COVID-19. Institutional protocols and algorithms that pertain to the evaluation of patients at risk for COVID-19 are in a state of rapid change based on information released by regulatory bodies including the CDC and federal and state organizations. These policies and algorithms were followed during the patient's care in the ED.   Final Clinical Impression(s) / ED Diagnoses Final diagnoses:  Viral illness  Sore throat    Rx / DC Orders ED Discharge Orders     None        Rosana Hoes 07/24/20 6010    Tilden Fossa, MD 07/24/20 1248

## 2020-07-24 NOTE — Discharge Instructions (Addendum)
You have a COVID test pending. This may take several hours to come back. You can check online or use the MyChart App to look at your results.   You can take Tylenol or Ibuprofen as directed for pain. You can alternate Tylenol and Ibuprofen every 4 hours. If you take Tylenol at 1pm, then you can take Ibuprofen at 5pm. Then you can take Tylenol again at 9pm.   Make sure you are staying hydrated and drinking plenty of fluids.   Return to the Emergency Dept for any difficulty breathing, vomiting/inability to keep any food or liquid downs, chest pain or any other worsening or concerning symptoms.

## 2020-08-02 DIAGNOSIS — M5412 Radiculopathy, cervical region: Secondary | ICD-10-CM | POA: Diagnosis not present

## 2020-08-04 DIAGNOSIS — M5412 Radiculopathy, cervical region: Secondary | ICD-10-CM | POA: Diagnosis not present

## 2020-08-07 DIAGNOSIS — M5412 Radiculopathy, cervical region: Secondary | ICD-10-CM | POA: Diagnosis not present

## 2020-08-07 DIAGNOSIS — M25512 Pain in left shoulder: Secondary | ICD-10-CM | POA: Diagnosis not present

## 2020-08-09 DIAGNOSIS — M5412 Radiculopathy, cervical region: Secondary | ICD-10-CM | POA: Diagnosis not present

## 2020-08-11 DIAGNOSIS — M5412 Radiculopathy, cervical region: Secondary | ICD-10-CM | POA: Diagnosis not present

## 2020-08-15 DIAGNOSIS — M25512 Pain in left shoulder: Secondary | ICD-10-CM | POA: Diagnosis not present

## 2020-08-15 DIAGNOSIS — M542 Cervicalgia: Secondary | ICD-10-CM | POA: Diagnosis not present

## 2020-08-21 DIAGNOSIS — Z419 Encounter for procedure for purposes other than remedying health state, unspecified: Secondary | ICD-10-CM | POA: Diagnosis not present

## 2020-09-21 DIAGNOSIS — Z419 Encounter for procedure for purposes other than remedying health state, unspecified: Secondary | ICD-10-CM | POA: Diagnosis not present

## 2020-10-17 DIAGNOSIS — M546 Pain in thoracic spine: Secondary | ICD-10-CM | POA: Diagnosis not present

## 2020-10-17 DIAGNOSIS — M5412 Radiculopathy, cervical region: Secondary | ICD-10-CM | POA: Diagnosis not present

## 2020-10-17 DIAGNOSIS — S134XXD Sprain of ligaments of cervical spine, subsequent encounter: Secondary | ICD-10-CM | POA: Diagnosis not present

## 2020-10-21 DIAGNOSIS — Z419 Encounter for procedure for purposes other than remedying health state, unspecified: Secondary | ICD-10-CM | POA: Diagnosis not present

## 2020-10-29 DIAGNOSIS — M546 Pain in thoracic spine: Secondary | ICD-10-CM | POA: Diagnosis not present

## 2020-11-02 ENCOUNTER — Encounter (HOSPITAL_COMMUNITY): Payer: Self-pay | Admitting: *Deleted

## 2020-11-02 ENCOUNTER — Other Ambulatory Visit: Payer: Self-pay

## 2020-11-02 ENCOUNTER — Emergency Department (HOSPITAL_COMMUNITY)
Admission: EM | Admit: 2020-11-02 | Discharge: 2020-11-03 | Disposition: A | Discharge status: Home / Self Care | Payer: Medicaid Other | Attending: Emergency Medicine | Admitting: Emergency Medicine

## 2020-11-02 DIAGNOSIS — R202 Paresthesia of skin: Secondary | ICD-10-CM | POA: Insufficient documentation

## 2020-11-02 DIAGNOSIS — Z91040 Latex allergy status: Secondary | ICD-10-CM | POA: Insufficient documentation

## 2020-11-02 DIAGNOSIS — R200 Anesthesia of skin: Secondary | ICD-10-CM | POA: Diagnosis not present

## 2020-11-02 DIAGNOSIS — M542 Cervicalgia: Secondary | ICD-10-CM | POA: Diagnosis not present

## 2020-11-02 DIAGNOSIS — M25512 Pain in left shoulder: Secondary | ICD-10-CM | POA: Insufficient documentation
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
## 2020-11-03 ENCOUNTER — Emergency Department (HOSPITAL_COMMUNITY): Payer: Medicaid Other

## 2020-11-03 DIAGNOSIS — M25512 Pain in left shoulder: Secondary | ICD-10-CM | POA: Diagnosis not present

## 2020-11-03 DIAGNOSIS — R200 Anesthesia of skin: Secondary | ICD-10-CM | POA: Diagnosis not present

## 2020-11-03 DIAGNOSIS — M542 Cervicalgia: Secondary | ICD-10-CM | POA: Diagnosis not present

## 2020-11-03 MED ORDER — OXYCODONE-ACETAMINOPHEN 5-325 MG PO TABS
1.0000 | ORAL_TABLET | Freq: Once | ORAL | Status: AC
  Administered 2020-11-03: 1 via ORAL
  Filled 2020-11-03: qty 1

## 2020-11-03 MED ORDER — PREDNISONE 10 MG PO TABS: 40.0000 mg | ORAL_TABLET | Freq: Every day | ORAL | 0 refills | Status: DC

## 2020-11-03 MED ORDER — KETOROLAC TROMETHAMINE 30 MG/ML IJ SOLN
30.0000 mg | Freq: Once | INTRAMUSCULAR | Status: AC
  Administered 2020-11-03: 30 mg via INTRAMUSCULAR
  Filled 2020-11-03: qty 1

## 2020-11-03 MED ORDER — METHOCARBAMOL 500 MG PO TABS: 500.0000 mg | ORAL_TABLET | Freq: Two times a day (BID) | ORAL | 0 refills | Status: DC

## 2020-11-03 NOTE — ED Notes (Signed)
Patient transported to X-ray 
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
## 2020-11-03 NOTE — Discharge Instructions (Addendum)
I strongly recommend following up with [facility] again. Inform them that the conservative therapy with pain medicine and anti-inflammatories has not been successful  Your x-rays without any acute abnormalities to explain your symptoms. It is very likely that your pain is being caused by a nerve that is being pinched in your neck.  I will provide you with a course of prednisone.  Please take the anti-inflammatories that you are prescribed including the ibuprofens.  I have also written a prescription for a muscle relaxer.  Please use warm compresses to your neck and shoulder, drink plenty of water, take your medications as prescribed.

## 2020-11-06 DIAGNOSIS — M792 Neuralgia and neuritis, unspecified: Secondary | ICD-10-CM | POA: Diagnosis not present

## 2020-11-21 DIAGNOSIS — Z419 Encounter for procedure for purposes other than remedying health state, unspecified: Secondary | ICD-10-CM | POA: Diagnosis not present

## 2020-11-29 DIAGNOSIS — M25512 Pain in left shoulder: Secondary | ICD-10-CM | POA: Diagnosis not present

## 2020-12-19 DIAGNOSIS — M898X1 Other specified disorders of bone, shoulder: Secondary | ICD-10-CM | POA: Diagnosis not present

## 2020-12-19 DIAGNOSIS — M25512 Pain in left shoulder: Secondary | ICD-10-CM | POA: Diagnosis not present

## 2020-12-21 DIAGNOSIS — Z419 Encounter for procedure for purposes other than remedying health state, unspecified: Secondary | ICD-10-CM | POA: Diagnosis not present

## 2020-12-27 DIAGNOSIS — M25512 Pain in left shoulder: Secondary | ICD-10-CM | POA: Diagnosis not present

## 2021-01-08 DIAGNOSIS — M25512 Pain in left shoulder: Secondary | ICD-10-CM | POA: Diagnosis not present

## 2021-01-10 DIAGNOSIS — R202 Paresthesia of skin: Secondary | ICD-10-CM | POA: Diagnosis not present

## 2021-01-21 DIAGNOSIS — Z419 Encounter for procedure for purposes other than remedying health state, unspecified: Secondary | ICD-10-CM | POA: Diagnosis not present

## 2021-02-07 DIAGNOSIS — G5622 Lesion of ulnar nerve, left upper limb: Secondary | ICD-10-CM | POA: Diagnosis not present

## 2021-02-21 DIAGNOSIS — Z419 Encounter for procedure for purposes other than remedying health state, unspecified: Secondary | ICD-10-CM | POA: Diagnosis not present

## 2021-03-20 DIAGNOSIS — S134XXD Sprain of ligaments of cervical spine, subsequent encounter: Secondary | ICD-10-CM | POA: Diagnosis not present

## 2021-03-20 DIAGNOSIS — M5412 Radiculopathy, cervical region: Secondary | ICD-10-CM | POA: Diagnosis not present

## 2021-03-21 DIAGNOSIS — Z419 Encounter for procedure for purposes other than remedying health state, unspecified: Secondary | ICD-10-CM | POA: Diagnosis not present

## 2021-03-22 DIAGNOSIS — M7918 Myalgia, other site: Secondary | ICD-10-CM | POA: Diagnosis not present

## 2021-03-22 DIAGNOSIS — Z79899 Other long term (current) drug therapy: Secondary | ICD-10-CM | POA: Diagnosis not present

## 2021-03-22 DIAGNOSIS — Z5181 Encounter for therapeutic drug level monitoring: Secondary | ICD-10-CM | POA: Diagnosis not present

## 2021-04-21 DIAGNOSIS — Z419 Encounter for procedure for purposes other than remedying health state, unspecified: Secondary | ICD-10-CM | POA: Diagnosis not present

## 2021-05-21 DIAGNOSIS — Z419 Encounter for procedure for purposes other than remedying health state, unspecified: Secondary | ICD-10-CM | POA: Diagnosis not present

## 2021-06-21 DIAGNOSIS — Z419 Encounter for procedure for purposes other than remedying health state, unspecified: Secondary | ICD-10-CM | POA: Diagnosis not present

## 2021-07-03 DIAGNOSIS — Z0189 Encounter for other specified special examinations: Secondary | ICD-10-CM | POA: Diagnosis not present

## 2021-07-03 DIAGNOSIS — Z113 Encounter for screening for infections with a predominantly sexual mode of transmission: Secondary | ICD-10-CM | POA: Diagnosis not present

## 2021-07-03 DIAGNOSIS — Z13220 Encounter for screening for lipoid disorders: Secondary | ICD-10-CM | POA: Diagnosis not present

## 2021-07-03 DIAGNOSIS — Z7182 Exercise counseling: Secondary | ICD-10-CM | POA: Diagnosis not present

## 2021-07-03 DIAGNOSIS — Z131 Encounter for screening for diabetes mellitus: Secondary | ICD-10-CM | POA: Diagnosis not present

## 2021-07-03 DIAGNOSIS — Z1329 Encounter for screening for other suspected endocrine disorder: Secondary | ICD-10-CM | POA: Diagnosis not present

## 2021-07-03 DIAGNOSIS — M25512 Pain in left shoulder: Secondary | ICD-10-CM | POA: Diagnosis not present

## 2021-07-03 DIAGNOSIS — Z1331 Encounter for screening for depression: Secondary | ICD-10-CM | POA: Diagnosis not present

## 2021-07-03 DIAGNOSIS — Z130 Encounter for screening for diseases of the blood and blood-forming organs and certain disorders involving the immune mechanism: Secondary | ICD-10-CM | POA: Diagnosis not present

## 2021-07-21 DIAGNOSIS — Z419 Encounter for procedure for purposes other than remedying health state, unspecified: Secondary | ICD-10-CM | POA: Diagnosis not present

## 2021-07-31 ENCOUNTER — Ambulatory Visit (HOSPITAL_COMMUNITY)
Admission: EM | Admit: 2021-07-31 | Discharge: 2021-07-31 | Disposition: A | Discharge status: Home / Self Care | Payer: Medicaid Other | Attending: Student | Admitting: Student

## 2021-07-31 DIAGNOSIS — M722 Plantar fascial fibromatosis: Secondary | ICD-10-CM | POA: Diagnosis not present

## 2021-07-31 DIAGNOSIS — Z975 Presence of (intrauterine) contraceptive device: Secondary | ICD-10-CM

## 2021-07-31 MED ORDER — STERILE WATER FOR INJECTION IJ SOLN
INTRAMUSCULAR | Status: AC
  Filled 2021-07-31: qty 10

## 2021-07-31 MED ORDER — METHYLPREDNISOLONE SODIUM SUCC 125 MG IJ SOLR
60.0000 mg | Freq: Once | INTRAMUSCULAR | Status: AC
  Administered 2021-07-31: 60 mg via INTRAMUSCULAR

## 2021-07-31 MED ORDER — NAPROXEN 500 MG PO TABS: 500.0000 mg | ORAL_TABLET | Freq: Two times a day (BID) | ORAL | 0 refills | Status: DC

## 2021-07-31 MED ORDER — METHYLPREDNISOLONE SODIUM SUCC 125 MG IJ SOLR
INTRAMUSCULAR | Status: AC
  Filled 2021-07-31: qty 2

## 2021-07-31 NOTE — ED Provider Notes (Signed)
[facility]    CSN: [id number] Arrival date & time: 07/31/21  1414      History   Chief Complaint Chief Complaint  Patient presents with   Ankle Pain    HPI Kathleen Bell is a 26 y.o. female presenting with right foot pain since waking up this morning.  History noncontributory.  She states that when she awoke and stood up for the first time, she had pain over the plantar aspect of the right foot, extending into the medial foot/midfoot.  The pain is tolerable at rest, but worse with plantar and dorsiflexion and walking.  She denies recent trauma or overuse.  Has not attempted interventions at home.  States she needs a work note.  HPI  Past Medical History:  Diagnosis Date   Fainting    Medical history non-contributory     Patient Active Problem List   Diagnosis Date Noted   Normal labor 11/18/2015   Abnormal maternal serum screening test 06/22/2015   [redacted] weeks gestation of pregnancy     Past Surgical History:  Procedure Laterality Date   DILATION AND CURETTAGE OF UTERUS     for retained placenta   NO PAST SURGERIES      OB History     Gravida  2   Para  2   Term  2   Preterm      AB      Living  2      SAB      IAB      Ectopic      Multiple  0   Live Births  1            Home Medications    Prior to Admission medications   Medication Sig Start Date End Date Taking? Authorizing Provider  naproxen (NAPROSYN) 500 MG tablet Take 1 tablet (500 mg total) by mouth 2 (two) times daily. 07/31/21  Yes Rhys Martini, PA-C  diphenhydrAMINE (BENADRYL) 25 MG tablet Take 25 mg by mouth every 6 (six) hours as needed.    [provider]  fluticasone (FLONASE) 50 MCG/ACT nasal spray Place into both nostrils daily.    [provider]  methocarbamol (ROBAXIN) 500 MG tablet Take 1 tablet (500 mg total) by mouth 2 (two) times daily. 11/03/20   Gailen Shelter, PA  predniSONE (DELTASONE) 10 MG tablet Take 4 tablets (40 mg total)  by mouth daily. 11/03/20   Gailen Shelter, PA    Family History Family History  Problem Relation Age of Onset   Healthy Mother    Healthy Father     Social History Social History   Tobacco Use   Smoking status: Never   Smokeless tobacco: Never  Vaping Use   Vaping Use: Never used  Substance Use Topics   Alcohol use: Yes   Drug use: No     Allergies   Latex   Review of Systems Review of Systems  Musculoskeletal:        R foot pain  All other systems reviewed and are negative.    Physical Exam Triage Vital Signs ED Triage Vitals [07/31/21 1520]  Enc Vitals Group     BP 120/83     Pulse Rate 64     Resp 16     Temp 98.1 F (36.7 C)     Temp Source Oral     SpO2 98 %     Weight      Height  Head Circumference      Peak Flow      Pain Score      Pain Loc      Pain Edu?      Excl. in GC?    No data found.  Updated Vital Signs BP 120/83 (BP Location: Left Arm)   Pulse 64   Temp 98.1 F (36.7 C) (Oral)   Resp 16   LMP 07/31/2021   SpO2 98%   Visual Acuity Right Eye Distance:   Left Eye Distance:   Bilateral Distance:    Right Eye Near:   Left Eye Near:    Bilateral Near:     Physical Exam Vitals reviewed.  Constitutional:      General: She is not in acute distress.    Appearance: Normal appearance. She is not ill-appearing.  HENT:     Head: Normocephalic and atraumatic.  Pulmonary:     Effort: Pulmonary effort is normal.  Musculoskeletal:     Comments: R foot - no skin changes or swelling. TTP plantar fascia. ROM plantar and dorsiflexion intact but with pain. No midfoot or malleolar pain. DP 2+, cap refill < 2 seconds.  Neurological:     General: No focal deficit present.     Mental Status: She is alert and oriented to person, place, and time.  Psychiatric:        Mood and Affect: Mood normal.        Behavior: Behavior normal.        Thought Content: Thought content normal.        Judgment: Judgment normal.      UC  Treatments / Results  Labs (all labs ordered are listed, but only abnormal results are displayed) Labs Reviewed - No data to display  EKG   Radiology No results found.  Procedures Procedures (including critical care time)  Medications Ordered in UC Medications  methylPREDNISolone sodium succinate (SOLU-MEDROL) 125 mg/2 mL injection 60 mg (has no administration in time range)    Initial Impression / Assessment and Plan / UC Course  I have reviewed the triage vital signs and the nursing notes.  Pertinent labs & imaging results that were available during my care of the patient were reviewed by me and considered in my medical decision making (see chart for details).     This patient is a very pleasant 26 y.o. year old female presenting with R foot - plantar fasciitis, nontraumatic. Neurovascularly intact. IUD contraception. IM solumedrol administered, and  naproxen sent. Work note provided. RICE. She actually has a follow-up with her orthopedist for her L shoulder 08/03/21, so she can also discuss the R foot then. ED return precautions discussed. Patient verbalizes understanding and agreement.    Final Clinical Impressions(s) / UC Diagnoses   Final diagnoses:  Plantar fasciitis of right foot  Uses hormone releasing intrauterine device (IUD) for contraception     Discharge Instructions      -Plantar fasciitis is a painful foot condition that affects the heel. It occurs when the band of tissue that connects the toes to the heel bone (plantar fascia) becomes irritated. This can happen as the result of exercising too much or doing other repetitive activities (overuse injury). Plantar fasciitis can cause mild irritation to severe pain that makes it difficult to walk or move. The pain is usually worse in the morning after sleeping, or after sitting or lying down for a period of time. Pain may also be worse after long periods of walking or  standing. -Naproxen for pain. Avoid other  NSAIDs while taking this medication, including ibuprofen    ED Prescriptions     Medication Sig Dispense Auth. Provider   naproxen (NAPROSYN) 500 MG tablet Take 1 tablet (500 mg total) by mouth 2 (two) times daily. 30 tablet Rhys Martini, PA-C      PDMP not reviewed this encounter.   Rhys Martini, PA-C 07/31/21 1601

## 2021-07-31 NOTE — Discharge Instructions (Signed)
-  Plantar fasciitis is a painful foot condition that affects the heel. It occurs when the band of tissue that connects the toes to the heel bone (plantar fascia) becomes irritated. This can happen as the result of exercising too much or doing other repetitive activities (overuse injury). Plantar fasciitis can cause mild irritation to severe pain that makes it difficult to walk or move. The pain is usually worse in the morning after sleeping, or after sitting or lying down for a period of time. Pain may also be worse after long periods of walking or standing. -Naproxen for pain. Avoid other NSAIDs while taking this medication, including ibuprofen

## 2021-07-31 NOTE — ED Triage Notes (Signed)
Pt reports right ankle and foot pain that started this morning. No known injuries or pain.

## 2021-08-03 DIAGNOSIS — M25512 Pain in left shoulder: Secondary | ICD-10-CM | POA: Diagnosis not present

## 2021-08-10 DIAGNOSIS — M67814 Other specified disorders of tendon, left shoulder: Secondary | ICD-10-CM | POA: Diagnosis not present

## 2021-08-10 DIAGNOSIS — M25512 Pain in left shoulder: Secondary | ICD-10-CM | POA: Diagnosis not present

## 2021-08-10 DIAGNOSIS — M75102 Unspecified rotator cuff tear or rupture of left shoulder, not specified as traumatic: Secondary | ICD-10-CM | POA: Diagnosis not present

## 2021-08-17 DIAGNOSIS — M25512 Pain in left shoulder: Secondary | ICD-10-CM | POA: Diagnosis not present

## 2021-08-17 DIAGNOSIS — G8929 Other chronic pain: Secondary | ICD-10-CM | POA: Diagnosis not present

## 2021-08-21 DIAGNOSIS — J302 Other seasonal allergic rhinitis: Secondary | ICD-10-CM | POA: Diagnosis not present

## 2021-08-21 DIAGNOSIS — G629 Polyneuropathy, unspecified: Secondary | ICD-10-CM | POA: Diagnosis not present

## 2021-08-21 DIAGNOSIS — Z791 Long term (current) use of non-steroidal anti-inflammatories (NSAID): Secondary | ICD-10-CM | POA: Diagnosis not present

## 2021-08-21 DIAGNOSIS — Z419 Encounter for procedure for purposes other than remedying health state, unspecified: Secondary | ICD-10-CM | POA: Diagnosis not present

## 2021-08-21 DIAGNOSIS — M1990 Unspecified osteoarthritis, unspecified site: Secondary | ICD-10-CM | POA: Diagnosis not present

## 2021-08-21 DIAGNOSIS — M7918 Myalgia, other site: Secondary | ICD-10-CM | POA: Diagnosis not present

## 2021-08-21 DIAGNOSIS — R030 Elevated blood-pressure reading, without diagnosis of hypertension: Secondary | ICD-10-CM | POA: Diagnosis not present

## 2021-08-21 DIAGNOSIS — Z79891 Long term (current) use of opiate analgesic: Secondary | ICD-10-CM | POA: Diagnosis not present

## 2021-09-11 DIAGNOSIS — M25312 Other instability, left shoulder: Secondary | ICD-10-CM | POA: Diagnosis not present

## 2021-09-11 DIAGNOSIS — M24112 Other articular cartilage disorders, left shoulder: Secondary | ICD-10-CM | POA: Diagnosis not present

## 2021-09-11 DIAGNOSIS — G8918 Other acute postprocedural pain: Secondary | ICD-10-CM | POA: Diagnosis not present

## 2021-09-13 DIAGNOSIS — Z7409 Other reduced mobility: Secondary | ICD-10-CM | POA: Diagnosis not present

## 2021-09-13 DIAGNOSIS — Z9889 Other specified postprocedural states: Secondary | ICD-10-CM | POA: Diagnosis not present

## 2021-09-13 DIAGNOSIS — M25612 Stiffness of left shoulder, not elsewhere classified: Secondary | ICD-10-CM | POA: Diagnosis not present

## 2021-09-13 DIAGNOSIS — M6281 Muscle weakness (generalized): Secondary | ICD-10-CM | POA: Diagnosis not present

## 2021-09-13 DIAGNOSIS — Z789 Other specified health status: Secondary | ICD-10-CM | POA: Diagnosis not present

## 2021-09-13 DIAGNOSIS — M25512 Pain in left shoulder: Secondary | ICD-10-CM | POA: Diagnosis not present

## 2021-09-18 DIAGNOSIS — Z9889 Other specified postprocedural states: Secondary | ICD-10-CM | POA: Diagnosis not present

## 2021-09-18 DIAGNOSIS — M25612 Stiffness of left shoulder, not elsewhere classified: Secondary | ICD-10-CM | POA: Diagnosis not present

## 2021-09-18 DIAGNOSIS — Z7409 Other reduced mobility: Secondary | ICD-10-CM | POA: Diagnosis not present

## 2021-09-18 DIAGNOSIS — Z789 Other specified health status: Secondary | ICD-10-CM | POA: Diagnosis not present

## 2021-09-18 DIAGNOSIS — M6281 Muscle weakness (generalized): Secondary | ICD-10-CM | POA: Diagnosis not present

## 2021-09-18 DIAGNOSIS — M25512 Pain in left shoulder: Secondary | ICD-10-CM | POA: Diagnosis not present

## 2021-09-20 DIAGNOSIS — M25512 Pain in left shoulder: Secondary | ICD-10-CM | POA: Diagnosis not present

## 2021-09-20 DIAGNOSIS — Z9889 Other specified postprocedural states: Secondary | ICD-10-CM | POA: Diagnosis not present

## 2021-09-20 DIAGNOSIS — Z789 Other specified health status: Secondary | ICD-10-CM | POA: Diagnosis not present

## 2021-09-20 DIAGNOSIS — M6281 Muscle weakness (generalized): Secondary | ICD-10-CM | POA: Diagnosis not present

## 2021-09-20 DIAGNOSIS — M25612 Stiffness of left shoulder, not elsewhere classified: Secondary | ICD-10-CM | POA: Diagnosis not present

## 2021-09-20 DIAGNOSIS — Z7409 Other reduced mobility: Secondary | ICD-10-CM | POA: Diagnosis not present

## 2021-09-21 DIAGNOSIS — Z419 Encounter for procedure for purposes other than remedying health state, unspecified: Secondary | ICD-10-CM | POA: Diagnosis not present

## 2021-09-25 DIAGNOSIS — M25512 Pain in left shoulder: Secondary | ICD-10-CM | POA: Diagnosis not present

## 2021-09-25 DIAGNOSIS — M25612 Stiffness of left shoulder, not elsewhere classified: Secondary | ICD-10-CM | POA: Diagnosis not present

## 2021-09-25 DIAGNOSIS — Z9889 Other specified postprocedural states: Secondary | ICD-10-CM | POA: Diagnosis not present

## 2021-09-25 DIAGNOSIS — M6281 Muscle weakness (generalized): Secondary | ICD-10-CM | POA: Diagnosis not present

## 2021-09-25 DIAGNOSIS — Z7409 Other reduced mobility: Secondary | ICD-10-CM | POA: Diagnosis not present

## 2021-09-25 DIAGNOSIS — Z789 Other specified health status: Secondary | ICD-10-CM | POA: Diagnosis not present

## 2021-09-27 DIAGNOSIS — M25612 Stiffness of left shoulder, not elsewhere classified: Secondary | ICD-10-CM | POA: Diagnosis not present

## 2021-09-27 DIAGNOSIS — Z9889 Other specified postprocedural states: Secondary | ICD-10-CM | POA: Diagnosis not present

## 2021-09-27 DIAGNOSIS — M6281 Muscle weakness (generalized): Secondary | ICD-10-CM | POA: Diagnosis not present

## 2021-09-27 DIAGNOSIS — Z7409 Other reduced mobility: Secondary | ICD-10-CM | POA: Diagnosis not present

## 2021-09-27 DIAGNOSIS — Z789 Other specified health status: Secondary | ICD-10-CM | POA: Diagnosis not present

## 2021-09-27 DIAGNOSIS — M25512 Pain in left shoulder: Secondary | ICD-10-CM | POA: Diagnosis not present

## 2021-10-02 DIAGNOSIS — Z9889 Other specified postprocedural states: Secondary | ICD-10-CM | POA: Diagnosis not present

## 2021-10-02 DIAGNOSIS — Z7409 Other reduced mobility: Secondary | ICD-10-CM | POA: Diagnosis not present

## 2021-10-02 DIAGNOSIS — M25612 Stiffness of left shoulder, not elsewhere classified: Secondary | ICD-10-CM | POA: Diagnosis not present

## 2021-10-02 DIAGNOSIS — Z789 Other specified health status: Secondary | ICD-10-CM | POA: Diagnosis not present

## 2021-10-02 DIAGNOSIS — M25512 Pain in left shoulder: Secondary | ICD-10-CM | POA: Diagnosis not present

## 2021-10-02 DIAGNOSIS — M6281 Muscle weakness (generalized): Secondary | ICD-10-CM | POA: Diagnosis not present

## 2021-10-05 DIAGNOSIS — Z789 Other specified health status: Secondary | ICD-10-CM | POA: Diagnosis not present

## 2021-10-05 DIAGNOSIS — M25612 Stiffness of left shoulder, not elsewhere classified: Secondary | ICD-10-CM | POA: Diagnosis not present

## 2021-10-05 DIAGNOSIS — M6281 Muscle weakness (generalized): Secondary | ICD-10-CM | POA: Diagnosis not present

## 2021-10-05 DIAGNOSIS — Z9889 Other specified postprocedural states: Secondary | ICD-10-CM | POA: Diagnosis not present

## 2021-10-05 DIAGNOSIS — M25512 Pain in left shoulder: Secondary | ICD-10-CM | POA: Diagnosis not present

## 2021-10-05 DIAGNOSIS — Z7409 Other reduced mobility: Secondary | ICD-10-CM | POA: Diagnosis not present

## 2021-10-12 DIAGNOSIS — M25612 Stiffness of left shoulder, not elsewhere classified: Secondary | ICD-10-CM | POA: Diagnosis not present

## 2021-10-12 DIAGNOSIS — Z9889 Other specified postprocedural states: Secondary | ICD-10-CM | POA: Diagnosis not present

## 2021-10-12 DIAGNOSIS — M25512 Pain in left shoulder: Secondary | ICD-10-CM | POA: Diagnosis not present

## 2021-10-12 DIAGNOSIS — Z7409 Other reduced mobility: Secondary | ICD-10-CM | POA: Diagnosis not present

## 2021-10-12 DIAGNOSIS — M6281 Muscle weakness (generalized): Secondary | ICD-10-CM | POA: Diagnosis not present

## 2021-10-12 DIAGNOSIS — Z789 Other specified health status: Secondary | ICD-10-CM | POA: Diagnosis not present

## 2021-10-17 DIAGNOSIS — M25512 Pain in left shoulder: Secondary | ICD-10-CM | POA: Diagnosis not present

## 2021-10-17 DIAGNOSIS — M6281 Muscle weakness (generalized): Secondary | ICD-10-CM | POA: Diagnosis not present

## 2021-10-17 DIAGNOSIS — Z7409 Other reduced mobility: Secondary | ICD-10-CM | POA: Diagnosis not present

## 2021-10-17 DIAGNOSIS — M25612 Stiffness of left shoulder, not elsewhere classified: Secondary | ICD-10-CM | POA: Diagnosis not present

## 2021-10-17 DIAGNOSIS — Z789 Other specified health status: Secondary | ICD-10-CM | POA: Diagnosis not present

## 2021-10-17 DIAGNOSIS — Z9889 Other specified postprocedural states: Secondary | ICD-10-CM | POA: Diagnosis not present

## 2021-10-18 DIAGNOSIS — M25612 Stiffness of left shoulder, not elsewhere classified: Secondary | ICD-10-CM | POA: Diagnosis not present

## 2021-10-18 DIAGNOSIS — Z9889 Other specified postprocedural states: Secondary | ICD-10-CM | POA: Diagnosis not present

## 2021-10-18 DIAGNOSIS — Z7409 Other reduced mobility: Secondary | ICD-10-CM | POA: Diagnosis not present

## 2021-10-18 DIAGNOSIS — M25512 Pain in left shoulder: Secondary | ICD-10-CM | POA: Diagnosis not present

## 2021-10-18 DIAGNOSIS — M6281 Muscle weakness (generalized): Secondary | ICD-10-CM | POA: Diagnosis not present

## 2021-10-18 DIAGNOSIS — Z789 Other specified health status: Secondary | ICD-10-CM | POA: Diagnosis not present

## 2021-10-21 DIAGNOSIS — Z419 Encounter for procedure for purposes other than remedying health state, unspecified: Secondary | ICD-10-CM | POA: Diagnosis not present

## 2021-10-30 DIAGNOSIS — Z7409 Other reduced mobility: Secondary | ICD-10-CM | POA: Diagnosis not present

## 2021-10-30 DIAGNOSIS — M6281 Muscle weakness (generalized): Secondary | ICD-10-CM | POA: Diagnosis not present

## 2021-10-30 DIAGNOSIS — M25512 Pain in left shoulder: Secondary | ICD-10-CM | POA: Diagnosis not present

## 2021-10-30 DIAGNOSIS — Z789 Other specified health status: Secondary | ICD-10-CM | POA: Diagnosis not present

## 2021-10-30 DIAGNOSIS — Z9889 Other specified postprocedural states: Secondary | ICD-10-CM | POA: Diagnosis not present

## 2021-11-01 DIAGNOSIS — M6281 Muscle weakness (generalized): Secondary | ICD-10-CM | POA: Diagnosis not present

## 2021-11-01 DIAGNOSIS — Z9889 Other specified postprocedural states: Secondary | ICD-10-CM | POA: Diagnosis not present

## 2021-11-01 DIAGNOSIS — M25612 Stiffness of left shoulder, not elsewhere classified: Secondary | ICD-10-CM | POA: Diagnosis not present

## 2021-11-01 DIAGNOSIS — Z789 Other specified health status: Secondary | ICD-10-CM | POA: Diagnosis not present

## 2021-11-01 DIAGNOSIS — M25512 Pain in left shoulder: Secondary | ICD-10-CM | POA: Diagnosis not present

## 2021-11-01 DIAGNOSIS — Z7409 Other reduced mobility: Secondary | ICD-10-CM | POA: Diagnosis not present

## 2021-11-06 DIAGNOSIS — M25612 Stiffness of left shoulder, not elsewhere classified: Secondary | ICD-10-CM | POA: Diagnosis not present

## 2021-11-06 DIAGNOSIS — M6281 Muscle weakness (generalized): Secondary | ICD-10-CM | POA: Diagnosis not present

## 2021-11-06 DIAGNOSIS — Z789 Other specified health status: Secondary | ICD-10-CM | POA: Diagnosis not present

## 2021-11-06 DIAGNOSIS — Z7409 Other reduced mobility: Secondary | ICD-10-CM | POA: Diagnosis not present

## 2021-11-06 DIAGNOSIS — M25512 Pain in left shoulder: Secondary | ICD-10-CM | POA: Diagnosis not present

## 2021-11-06 DIAGNOSIS — Z9889 Other specified postprocedural states: Secondary | ICD-10-CM | POA: Diagnosis not present

## 2021-11-08 DIAGNOSIS — M25612 Stiffness of left shoulder, not elsewhere classified: Secondary | ICD-10-CM | POA: Diagnosis not present

## 2021-11-08 DIAGNOSIS — M6281 Muscle weakness (generalized): Secondary | ICD-10-CM | POA: Diagnosis not present

## 2021-11-08 DIAGNOSIS — Z9889 Other specified postprocedural states: Secondary | ICD-10-CM | POA: Diagnosis not present

## 2021-11-08 DIAGNOSIS — Z7409 Other reduced mobility: Secondary | ICD-10-CM | POA: Diagnosis not present

## 2021-11-08 DIAGNOSIS — Z789 Other specified health status: Secondary | ICD-10-CM | POA: Diagnosis not present

## 2021-11-13 DIAGNOSIS — M25512 Pain in left shoulder: Secondary | ICD-10-CM | POA: Diagnosis not present

## 2021-11-13 DIAGNOSIS — M25612 Stiffness of left shoulder, not elsewhere classified: Secondary | ICD-10-CM | POA: Diagnosis not present

## 2021-11-13 DIAGNOSIS — Z9889 Other specified postprocedural states: Secondary | ICD-10-CM | POA: Diagnosis not present

## 2021-11-13 DIAGNOSIS — Z7409 Other reduced mobility: Secondary | ICD-10-CM | POA: Diagnosis not present

## 2021-11-13 DIAGNOSIS — M6281 Muscle weakness (generalized): Secondary | ICD-10-CM | POA: Diagnosis not present

## 2021-11-13 DIAGNOSIS — Z789 Other specified health status: Secondary | ICD-10-CM | POA: Diagnosis not present

## 2021-11-21 ENCOUNTER — Ambulatory Visit (HOSPITAL_COMMUNITY)
Admission: EM | Admit: 2021-11-21 | Discharge: 2021-11-21 | Disposition: A | Discharge status: Home / Self Care | Payer: Medicaid Other | Attending: Internal Medicine | Admitting: Internal Medicine

## 2021-11-21 ENCOUNTER — Encounter (HOSPITAL_COMMUNITY): Payer: Self-pay

## 2021-11-21 DIAGNOSIS — R112 Nausea with vomiting, unspecified: Secondary | ICD-10-CM | POA: Diagnosis not present

## 2021-11-21 DIAGNOSIS — Z3202 Encounter for pregnancy test, result negative: Secondary | ICD-10-CM | POA: Diagnosis not present

## 2021-11-21 DIAGNOSIS — R109 Unspecified abdominal pain: Secondary | ICD-10-CM | POA: Diagnosis not present

## 2021-11-21 DIAGNOSIS — Z419 Encounter for procedure for purposes other than remedying health state, unspecified: Secondary | ICD-10-CM | POA: Diagnosis not present

## 2021-11-21 LAB — POC URINE PREG, ED
Preg Test, Ur: NEGATIVE
Preg Test, Ur: NEGATIVE

## 2021-11-21 LAB — POCT URINALYSIS DIPSTICK, ED / UC
Bilirubin Urine: NEGATIVE
Glucose, UA: 100 mg/dL — AB
Ketones, ur: NEGATIVE mg/dL
Nitrite: NEGATIVE
Protein, ur: 30 mg/dL — AB
Specific Gravity, Urine: 1.015 (ref 1.005–1.030)
Urobilinogen, UA: 1 mg/dL (ref 0.0–1.0)
pH: 6 (ref 5.0–8.0)

## 2021-11-21 LAB — CBG MONITORING, ED: Glucose-Capillary: 105 mg/dL — ABNORMAL HIGH (ref 70–99)

## 2021-11-21 MED ORDER — ONDANSETRON 4 MG PO TBDP
4.0000 mg | ORAL_TABLET | Freq: Once | ORAL | Status: AC
  Administered 2021-11-21: 4 mg via ORAL

## 2021-11-21 MED ORDER — ONDANSETRON 4 MG PO TBDP: 4.0000 mg | ORAL_TABLET | Freq: Three times a day (TID) | ORAL | 0 refills | Status: DC | PRN

## 2021-11-21 MED ORDER — KETOROLAC TROMETHAMINE 30 MG/ML IJ SOLN
30.0000 mg | Freq: Once | INTRAMUSCULAR | Status: AC
  Administered 2021-11-21: 30 mg via INTRAMUSCULAR

## 2021-11-21 MED ORDER — ONDANSETRON 4 MG PO TBDP
ORAL_TABLET | ORAL | Status: AC
  Filled 2021-11-21: qty 1

## 2021-11-21 MED ORDER — KETOROLAC TROMETHAMINE 30 MG/ML IJ SOLN
INTRAMUSCULAR | Status: AC
  Filled 2021-11-21: qty 1

## 2021-11-21 NOTE — ED Provider Notes (Signed)
[facility]    CSN: [id number] Arrival date & time: 11/21/21  1005      History   Chief Complaint Chief Complaint  Patient presents with   Emesis   Fever    HPI Kathleen Bell is a 26 y.o. female.   Patient presents to urgent care for evaluation of fever/chills, nausea, vomiting, and abdominal bloating that started 3 ago. She is currently on her menstrual cycle and states that the abdominal bloating, cramping, and lower back discomfort are all typical for her menstrual cycle. She takes oral birth control consistently as prescribed and has not missed any doses. Her vaginal bleeding is consistent with her menstrual cycle and is not heavier than normal. Emesis has been non bloody/bilious. She has been taking midol and tylenol for period cramps. Reports dizziness mostly upon standing. Denies fatigue, weakness, chest pain, shortness of breath, syncope, numbness/tingling to the bilateral lower extremities, and vaginal discharge/odor/itch. Reports slight urinary frequency with mild dysuria prior to onset of menstrual cycle. She is sexually active with monogamous female partner and is not concerned for STD at this time. Abdominal cramping and pain "feels like contractions in labor" and are currently an 8 on a scale of 0-10. States she felt warm to the touch and very cold yesterday but did not take her temperature at home. Last dose of midol/tylenol was this morning at approximately 9am. Last episode of emesis was last night, she has been able to tolerate water since last episode of emesis but has not attempted to eat solids. No recent intake of raw or undercooked meats or other known sick contacts with similar symptoms. No other triggering or relieving factors identified at this time for symptoms.    Emesis Associated symptoms: fever   Fever Associated symptoms: vomiting     Past Medical History:  Diagnosis Date   Fainting    Medical history non-contributory     Patient Active  Problem List   Diagnosis Date Noted   Normal labor 11/18/2015   Abnormal maternal serum screening test 06/22/2015   [redacted] weeks gestation of pregnancy     Past Surgical History:  Procedure Laterality Date   DILATION AND CURETTAGE OF UTERUS     for retained placenta   NO PAST SURGERIES      OB History     Gravida  2   Para  2   Term  2   Preterm      AB      Living  2      SAB      IAB      Ectopic      Multiple  0   Live Births  1            Home Medications    Prior to Admission medications   Medication Sig Start Date End Date Taking? Authorizing Provider  ondansetron (ZOFRAN-ODT) 4 MG disintegrating tablet Take 1 tablet (4 mg total) by mouth every 8 (eight) hours as needed for nausea or vomiting. 11/21/21  Yes Carlisle Beers, FNP  diphenhydrAMINE (BENADRYL) 25 MG tablet Take 25 mg by mouth every 6 (six) hours as needed.    [provider]  fluticasone (FLONASE) 50 MCG/ACT nasal spray Place into both nostrils daily.    [provider]  methocarbamol (ROBAXIN) 500 MG tablet Take 1 tablet (500 mg total) by mouth 2 (two) times daily. 11/03/20   Gailen Shelter, PA  naproxen (NAPROSYN) 500 MG tablet Take 1 tablet (500  mg total) by mouth 2 (two) times daily. 07/31/21   Rhys Martini, PA-C  predniSONE (DELTASONE) 10 MG tablet Take 4 tablets (40 mg total) by mouth daily. 11/03/20   Gailen Shelter, PA    Family History Family History  Problem Relation Age of Onset   Healthy Mother    Healthy Father     Social History Social History   Tobacco Use   Smoking status: Never   Smokeless tobacco: Never  Vaping Use   Vaping Use: Never used  Substance Use Topics   Alcohol use: Yes   Drug use: No     Allergies   Latex   Review of Systems Review of Systems  Constitutional:  Positive for fever.  Gastrointestinal:  Positive for vomiting.  Per HPI   Physical Exam Triage Vital Signs ED Triage Vitals  Enc Vitals Group      BP 11/21/21 1121 (!) 119/95     Pulse Rate 11/21/21 1121 (!) 103     Resp 11/21/21 1121 12     Temp 11/21/21 1121 97.8 F (36.6 C)     Temp Source 11/21/21 1121 Oral     SpO2 11/21/21 1121 100 %     Weight --      Height --      Head Circumference --      Peak Flow --      Pain Score 11/21/21 1118 8     Pain Loc --      Pain Edu? --      Excl. in GC? --    No data found.  Updated Vital Signs BP (!) 119/95 (BP Location: Left Arm)   Pulse (!) 103   Temp 97.8 F (36.6 C) (Oral)   Resp 12   SpO2 100%   Visual Acuity Right Eye Distance:   Left Eye Distance:   Bilateral Distance:    Right Eye Near:   Left Eye Near:    Bilateral Near:     Physical Exam Vitals and nursing note reviewed.  Constitutional:      Appearance: She is ill-appearing and diaphoretic. She is not toxic-appearing.  HENT:     Head: Normocephalic and atraumatic.     Right Ear: Hearing, tympanic membrane, ear canal and external ear normal.     Left Ear: Hearing, tympanic membrane, ear canal and external ear normal.     Nose: Nose normal.     Mouth/Throat:     Lips: Pink.     Mouth: Mucous membranes are moist.     Pharynx: No posterior oropharyngeal erythema.  Eyes:     General: Lids are normal. Vision grossly intact. Gaze aligned appropriately.        Right eye: No discharge.        Left eye: No discharge.     Extraocular Movements: Extraocular movements intact.     Conjunctiva/sclera: Conjunctivae normal.     Pupils: Pupils are equal, round, and reactive to light.  Cardiovascular:     Rate and Rhythm: Normal rate and regular rhythm.     Heart sounds: Normal heart sounds, S1 normal and S2 normal.  Pulmonary:     Effort: Pulmonary effort is normal. No respiratory distress.     Breath sounds: Normal breath sounds and air entry.  Abdominal:     General: Abdomen is flat. Bowel sounds are normal.     Palpations: Abdomen is soft.     Tenderness: There is abdominal tenderness in the suprapubic area.  There is no right CVA tenderness, left CVA tenderness or guarding.     Comments: No peritoneal signs.   Musculoskeletal:        General: Normal range of motion.     Cervical back: Normal range of motion and neck supple.  Lymphadenopathy:     Cervical: No cervical adenopathy.  Skin:    General: Skin is warm.     Capillary Refill: Capillary refill takes less than 2 seconds.     Findings: No rash.  Neurological:     General: No focal deficit present.     Mental Status: She is alert and oriented to person, place, and time. Mental status is at baseline.     Cranial Nerves: No dysarthria or facial asymmetry.     Motor: No weakness.     Gait: Gait normal.  Psychiatric:        Mood and Affect: Mood normal.        Speech: Speech normal.        Behavior: Behavior normal.        Thought Content: Thought content normal.        Judgment: Judgment normal.      UC Treatments / Results  Labs (all labs ordered are listed, but only abnormal results are displayed) Labs Reviewed  URINE CULTURE - Abnormal; Notable for the following components:      Result Value   Culture MULTIPLE SPECIES PRESENT, SUGGEST RECOLLECTION (*)    All other components within normal limits  POCT URINALYSIS DIPSTICK, ED / UC - Abnormal; Notable for the following components:   Glucose, UA 100 (*)    Hgb urine dipstick LARGE (*)    Protein, ur 30 (*)    Leukocytes,Ua TRACE (*)    All other components within normal limits  CBG MONITORING, ED - Abnormal; Notable for the following components:   Glucose-Capillary 105 (*)    All other components within normal limits  POC URINE PREG, ED  POC URINE PREG, ED    EKG   Radiology No results found.  Procedures Procedures (including critical care time)  Medications Ordered in UC Medications  ondansetron (ZOFRAN-ODT) disintegrating tablet 4 mg (4 mg Oral Given 11/21/21 1312)  ketorolac (TORADOL) 30 MG/ML injection 30 mg (30 mg Intramuscular Given 11/21/21 1315)     Initial Impression / Assessment and Plan / UC Course  I have reviewed the triage vital signs and the nursing notes.  Pertinent labs & imaging results that were available during my care of the patient were reviewed by me and considered in my medical decision making (see chart for details).   1. Nausea and vomiting, negative pregnancy test Zofran 4mg  ODT and ketorolac given in clinic to help with nausea and abdominal discomfort. Abdominal exam is stable without peritoneal signs. Patient is nontoxic appearing although heart rate is slightly elevated. She does not appear to be significantly clinically dehydrated to physical exam. Unclear etiology of symptoms. Urine pregnancy test is negative ruling out ectopic pregnancy. Urinalysis is shows signs of acute cystitis, although difficult to discern if hematuria is a result of vaginal bleeding versus from the urinary tract. Plan to treat for acute cystitis based on results of urine culture, patient agreeable to this. Low suspicion for pyelonephritis.   We will manage this with rest, fluids, and antiemetic medications to help with symptoms and hydration. May take zofran every 8 hours as needed at home for nausea and vomiting. No NSAID for the next 12 hours due to ketorolac  injection in clinic. Increased water intake to at least 64 ounces of water per day recommended to stay well hydrated, may use pedialyte in the meantime while recovering from illness to help with hydration.   Discussed physical exam and available lab work findings in clinic with patient.  Counseled patient regarding appropriate use of medications and potential side effects for all medications recommended or prescribed today. Discussed red flag signs and symptoms of worsening condition,when to call the PCP office, return to urgent care, and when to seek higher level of care in the emergency department. Patient verbalizes understanding and agreement with plan. All questions answered. Patient  discharged in stable condition.   Final Clinical Impressions(s) / UC Diagnoses   Final diagnoses:  Nausea and vomiting, unspecified vomiting type  Negative pregnancy test     Discharge Instructions      Your evaluation suggests that your symptoms are most likely due to viral illness (gastroenteritis) which will improve on its own with rest and fluids. Remember to drink plenty of fluids at home.   Pregnancy test is negative. Urinalysis shows white blood cells which may be a sign if a urinary tract infection. I have sent your urine for culture and will call you with these results only if this changes our treatment plan if you need an antibiotic for UTI.   I have prescribed an antinausea medication for you to take at home called Zofran. It is the same medication that we gave you in the office. Your next dose may be at 8:30pm tonight since we gave you a dose here.  You may use tylenol/ibuprofen as needed for abdominal discomfort related to this virus.   Eat a bland diet for the next 12-24 hours (bananas, rice, white toast, and applesauce). These foods are easy for your stomach to digest. Pedialyte can be purchased to help with rehydration. Drink plenty of water.   Please follow up with your primary care provider for further management. Return if you experience worsening or uncontrolled pain, inability to tolerate fluids by mouth, difficulty breathing, fevers 100.6F or greater, recurrent vomiting, or any other concerning symptoms.  I hope you feel better!      ED Prescriptions     Medication Sig Dispense Auth. Provider   ondansetron (ZOFRAN-ODT) 4 MG disintegrating tablet Take 1 tablet (4 mg total) by mouth every 8 (eight) hours as needed for nausea or vomiting. 20 tablet Carlisle Beers, FNP      PDMP not reviewed this encounter.   Carlisle Beers, [place] 11/24/21 1036

## 2021-11-21 NOTE — Discharge Instructions (Addendum)
Your evaluation suggests that your symptoms are most likely due to viral illness (gastroenteritis) which will improve on its own with rest and fluids. Remember to drink plenty of fluids at home.   Pregnancy test is negative. Urinalysis shows white blood cells which may be a sign if a urinary tract infection. I have sent your urine for culture and will call you with these results only if this changes our treatment plan if you need an antibiotic for UTI.   I have prescribed an antinausea medication for you to take at home called Zofran. It is the same medication that we gave you in the office. Your next dose may be at 8:30pm tonight since we gave you a dose here.  You may use tylenol/ibuprofen as needed for abdominal discomfort related to this virus.   Eat a bland diet for the next 12-24 hours (bananas, rice, white toast, and applesauce). These foods are easy for your stomach to digest. Pedialyte can be purchased to help with rehydration. Drink plenty of water.   Please follow up with your primary care provider for further management. Return if you experience worsening or uncontrolled pain, inability to tolerate fluids by mouth, difficulty breathing, fevers 100.6F or greater, recurrent vomiting, or any other concerning symptoms.  I hope you feel better!

## 2021-11-21 NOTE — ED Triage Notes (Signed)
Pt is here for fever, vomiting ,stomach pain , back  pain, and dizziness x3days pt took tylenol and midol this morning

## 2021-11-22 LAB — URINE CULTURE

## 2021-11-27 DIAGNOSIS — Z7409 Other reduced mobility: Secondary | ICD-10-CM | POA: Diagnosis not present

## 2021-11-27 DIAGNOSIS — G8929 Other chronic pain: Secondary | ICD-10-CM | POA: Diagnosis not present

## 2021-11-27 DIAGNOSIS — M25512 Pain in left shoulder: Secondary | ICD-10-CM | POA: Diagnosis not present

## 2021-11-27 DIAGNOSIS — M25612 Stiffness of left shoulder, not elsewhere classified: Secondary | ICD-10-CM | POA: Diagnosis not present

## 2021-11-27 DIAGNOSIS — Z789 Other specified health status: Secondary | ICD-10-CM | POA: Diagnosis not present

## 2021-11-27 DIAGNOSIS — R29898 Other symptoms and signs involving the musculoskeletal system: Secondary | ICD-10-CM | POA: Diagnosis not present

## 2021-12-06 DIAGNOSIS — R29898 Other symptoms and signs involving the musculoskeletal system: Secondary | ICD-10-CM | POA: Diagnosis not present

## 2021-12-06 DIAGNOSIS — M25512 Pain in left shoulder: Secondary | ICD-10-CM | POA: Diagnosis not present

## 2021-12-06 DIAGNOSIS — Z789 Other specified health status: Secondary | ICD-10-CM | POA: Diagnosis not present

## 2021-12-06 DIAGNOSIS — Z7409 Other reduced mobility: Secondary | ICD-10-CM | POA: Diagnosis not present

## 2021-12-06 DIAGNOSIS — G8929 Other chronic pain: Secondary | ICD-10-CM | POA: Diagnosis not present

## 2021-12-11 DIAGNOSIS — Z789 Other specified health status: Secondary | ICD-10-CM | POA: Diagnosis not present

## 2021-12-11 DIAGNOSIS — G8929 Other chronic pain: Secondary | ICD-10-CM | POA: Diagnosis not present

## 2021-12-11 DIAGNOSIS — Z7409 Other reduced mobility: Secondary | ICD-10-CM | POA: Diagnosis not present

## 2021-12-11 DIAGNOSIS — M25512 Pain in left shoulder: Secondary | ICD-10-CM | POA: Diagnosis not present

## 2021-12-11 DIAGNOSIS — R29898 Other symptoms and signs involving the musculoskeletal system: Secondary | ICD-10-CM | POA: Diagnosis not present

## 2021-12-21 DIAGNOSIS — Z419 Encounter for procedure for purposes other than remedying health state, unspecified: Secondary | ICD-10-CM | POA: Diagnosis not present

## 2021-12-25 DIAGNOSIS — Z7409 Other reduced mobility: Secondary | ICD-10-CM | POA: Diagnosis not present

## 2021-12-25 DIAGNOSIS — R29898 Other symptoms and signs involving the musculoskeletal system: Secondary | ICD-10-CM | POA: Diagnosis not present

## 2021-12-25 DIAGNOSIS — G8929 Other chronic pain: Secondary | ICD-10-CM | POA: Diagnosis not present

## 2021-12-25 DIAGNOSIS — M25512 Pain in left shoulder: Secondary | ICD-10-CM | POA: Diagnosis not present

## 2021-12-25 DIAGNOSIS — Z789 Other specified health status: Secondary | ICD-10-CM | POA: Diagnosis not present

## 2021-12-27 DIAGNOSIS — M25512 Pain in left shoulder: Secondary | ICD-10-CM | POA: Diagnosis not present

## 2021-12-27 DIAGNOSIS — Z789 Other specified health status: Secondary | ICD-10-CM | POA: Diagnosis not present

## 2021-12-27 DIAGNOSIS — Z7409 Other reduced mobility: Secondary | ICD-10-CM | POA: Diagnosis not present

## 2021-12-27 DIAGNOSIS — G8929 Other chronic pain: Secondary | ICD-10-CM | POA: Diagnosis not present

## 2021-12-27 DIAGNOSIS — R29898 Other symptoms and signs involving the musculoskeletal system: Secondary | ICD-10-CM | POA: Diagnosis not present

## 2022-01-01 ENCOUNTER — Encounter (HOSPITAL_COMMUNITY): Payer: Self-pay

## 2022-01-01 ENCOUNTER — Ambulatory Visit (HOSPITAL_COMMUNITY)
Admission: EM | Admit: 2022-01-01 | Discharge: 2022-01-01 | Disposition: A | Discharge status: Home / Self Care | Payer: Medicaid Other | Attending: Emergency Medicine | Admitting: Emergency Medicine

## 2022-01-01 DIAGNOSIS — R051 Acute cough: Secondary | ICD-10-CM

## 2022-01-01 DIAGNOSIS — J0110 Acute frontal sinusitis, unspecified: Secondary | ICD-10-CM | POA: Diagnosis not present

## 2022-01-01 MED ORDER — BENZONATATE 100 MG PO CAPS: 100.0000 mg | ORAL_CAPSULE | Freq: Three times a day (TID) | ORAL | 0 refills | Status: DC

## 2022-01-01 MED ORDER — AMOXICILLIN-POT CLAVULANATE 875-125 MG PO TABS: 1.0000 | ORAL_TABLET | Freq: Two times a day (BID) | ORAL | 0 refills | Status: DC

## 2022-01-01 MED ORDER — PROMETHAZINE-DM 6.25-15 MG/5ML PO SYRP: 5.0000 mL | ORAL_SOLUTION | Freq: Four times a day (QID) | ORAL | 0 refills | Status: DC | PRN

## 2022-01-01 NOTE — ED Provider Notes (Signed)
[facility]    CSN: [id number] Arrival date & time: 01/01/22  0806      History   Chief Complaint Chief Complaint  Patient presents with   Cough    HPI Farrell Johal is a 26 y.o. female.  Patient presents complaining of productive cough, sore throat, and nasal congestion that has been ongoing for over 1 week.  Patient reports having green sputum with a tinge of blood. patient reports chest congestion.  Patient reports difficulty sleeping at night due to the cough.  Patient reports painful swallowing.  Patient reports being exposed to her daughter who has been sick with a illness of unknown etiology.  Patient has used over-the-counter measures and warm fluids with no relief of symptoms.    Cough Associated symptoms: chest pain (Soreness caused by coughing and worsened with cough), rhinorrhea and sore throat   Associated symptoms: no chills, no ear pain, no fever, no shortness of breath and no wheezing     Past Medical History:  Diagnosis Date   Fainting    Medical history non-contributory     Patient Active Problem List   Diagnosis Date Noted   Normal labor 11/18/2015   Abnormal maternal serum screening test 06/22/2015   [redacted] weeks gestation of pregnancy     Past Surgical History:  Procedure Laterality Date   DILATION AND CURETTAGE OF UTERUS     for retained placenta   NO PAST SURGERIES      OB History     Gravida  2   Para  2   Term  2   Preterm      AB      Living  2      SAB      IAB      Ectopic      Multiple  0   Live Births  1            Home Medications    Prior to Admission medications   Medication Sig Start Date End Date Taking? Authorizing Provider  amoxicillin-clavulanate (AUGMENTIN) 875-125 MG tablet Take 1 tablet by mouth every 12 (twelve) hours. 01/01/22  Yes Debby Freiberg, NP  benzonatate (TESSALON) 100 MG capsule Take 1 capsule (100 mg total) by mouth every 8 (eight) hours. 01/01/22  Yes Debby Freiberg, NP  promethazine-dextromethorphan (PROMETHAZINE-DM) 6.25-15 MG/5ML syrup Take 5 mLs by mouth 4 (four) times daily as needed for cough. 01/01/22  Yes Debby Freiberg, NP    Family History Family History  Problem Relation Age of Onset   Healthy Mother    Healthy Father     Social History Social History   Tobacco Use   Smoking status: Never   Smokeless tobacco: Never  Vaping Use   Vaping Use: Never used  Substance Use Topics   Alcohol use: Yes   Drug use: No     Allergies   Latex   Review of Systems Review of Systems  Constitutional:  Negative for activity change, appetite change, chills, fatigue and fever.  HENT:  Positive for congestion, rhinorrhea, sinus pressure, sinus pain and sore throat. Negative for ear discharge, ear pain, postnasal drip and voice change.   Eyes: Negative.   Respiratory:  Positive for cough. Negative for chest tightness, shortness of breath and wheezing.   Cardiovascular:  Positive for chest pain (Soreness caused by coughing and worsened with cough).  Gastrointestinal:  Negative for abdominal pain, nausea and vomiting.     Physical Exam Triage  Vital Signs ED Triage Vitals  Enc Vitals Group     BP 01/01/22 0829 (!) 120/96     Pulse Rate 01/01/22 0829 94     Resp 01/01/22 0829 18     Temp 01/01/22 0829 97.9 F (36.6 C)     Temp Source 01/01/22 0829 Oral     SpO2 01/01/22 0829 98 %     Weight --      Height --      Head Circumference --      Peak Flow --      Pain Score 01/01/22 0830 8     Pain Loc --      Pain Edu? --      Excl. in GC? --    No data found.  Updated Vital Signs BP (!) 120/96 (BP Location: Right Arm)   Pulse 94   Temp 97.9 F (36.6 C) (Oral)   Resp 18   SpO2 98%   Breastfeeding No      Physical Exam Vitals and nursing note reviewed.  Constitutional:      Appearance: Normal appearance.  HENT:     Right Ear: Hearing, tympanic membrane, ear canal and external ear normal.     Left Ear: Hearing,  tympanic membrane, ear canal and external ear normal.     Nose: Congestion and rhinorrhea present. Rhinorrhea is purulent.     Right Turbinates: Enlarged and swollen. Not pale.     Left Turbinates: Enlarged and swollen. Not pale.     Right Sinus: Frontal sinus tenderness present. No maxillary sinus tenderness.     Left Sinus: Frontal sinus tenderness present. No maxillary sinus tenderness.     Mouth/Throat:     Mouth: Mucous membranes are moist.     Dentition: Normal dentition.     Pharynx: Uvula midline. Pharyngeal swelling and posterior oropharyngeal erythema present. No uvula swelling.     Tonsils: Tonsillar exudate (LFt tonsil exudate) present. 2+ on the right. 2+ on the left.  Cardiovascular:     Rate and Rhythm: Normal rate and regular rhythm.     Heart sounds: Normal heart sounds, S1 normal and S2 normal.  Pulmonary:     Effort: Pulmonary effort is normal.     Breath sounds: Normal breath sounds and air entry. No decreased breath sounds, wheezing, rhonchi or rales.  Lymphadenopathy:     Head:     Right side of head: Tonsillar adenopathy present.     Left side of head: Tonsillar adenopathy present.     Cervical: Cervical adenopathy present.     Right cervical: Superficial cervical adenopathy present. No deep or posterior cervical adenopathy.    Left cervical: Superficial cervical adenopathy present. No deep or posterior cervical adenopathy.  Neurological:     Mental Status: She is alert.      UC Treatments / Results  Labs (all labs ordered are listed, but only abnormal results are displayed) Labs Reviewed - No data to display  EKG   Radiology No results found.  Procedures Procedures (including critical care time)  Medications Ordered in UC Medications - No data to display  Initial Impression / Assessment and Plan / UC Course  I have reviewed the triage vital signs and the nursing notes.  Pertinent labs & imaging results that were available during my care of the  patient were reviewed by me and considered in my medical decision making (see chart for details).     Patient was evaluated for acute frontal sinus. Augmentin  was sent to the pharmacy, patient was made aware of treatment regiment and possible side effects. Promethazine and Tessalon was sent for symptom management.  Patient was made aware of safety precautions with Promethazine DM.  Possible streptococcal or tonsillitis etiology based on physical assessment, antimicrobial medication will provide coverage for this along with the sinusitis.  Patient declined work note due to currently being at home. Patient made aware of timeline for symptom resolution and when follow-up would be necessary.  Patient verbalized understanding of instructions.    Charting was provided using a a verbal dictation system, charting was proofread for errors, errors may occur which could change the meaning of the information charted.   Final Clinical Impressions(s) / UC Diagnoses   Final diagnoses:  Acute non-recurrent frontal sinusitis  Acute cough     Discharge Instructions      Augmentin has been sent to the pharmacy, you will take this 2 times a day for the next 7 days.  Please make sure to finish all of the antibiotics even if symptoms improve before completion.  Please avoid taking this medication on an empty stomach. Promethazine DM has been sent to the pharmacy, you can use this medication before bedtime, please do not operate any heavy machinery or drive a car after taking this medication. Tessalon has been sent to the pharmacy for cough, you can use this medication every 8 hours as needed.   Chloraseptic throat spray can be purchased over-the-counter, this can help with your sore throat.   You may follow up if symptoms are not improving with treatment regimen.   Please make sure to stay hydrated drinking 8 cups of water daily.      ED Prescriptions     Medication Sig Dispense Auth. Provider    amoxicillin-clavulanate (AUGMENTIN) 875-125 MG tablet Take 1 tablet by mouth every 12 (twelve) hours. 14 tablet Debby Freiberg, NP   promethazine-dextromethorphan (PROMETHAZINE-DM) 6.25-15 MG/5ML syrup Take 5 mLs by mouth 4 (four) times daily as needed for cough. 35 mL Debby Freiberg, NP   benzonatate (TESSALON) 100 MG capsule Take 1 capsule (100 mg total) by mouth every 8 (eight) hours. 24 capsule Debby Freiberg, NP      PDMP not reviewed this encounter.   Debby Freiberg, NP 01/01/22 1048

## 2022-01-01 NOTE — Discharge Instructions (Addendum)
Augmentin has been sent to the pharmacy, you will take this 2 times a day for the next 7 days.  Please make sure to finish all of the antibiotics even if symptoms improve before completion.  Please avoid taking this medication on an empty stomach. Promethazine DM has been sent to the pharmacy, you can use this medication before bedtime, please do not operate any heavy machinery or drive a car after taking this medication. Tessalon has been sent to the pharmacy for cough, you can use this medication every 8 hours as needed.   Chloraseptic throat spray can be purchased over-the-counter, this can help with your sore throat.   You may follow up if symptoms are not improving with treatment regimen.   Please make sure to stay hydrated drinking 8 cups of water daily.

## 2022-01-01 NOTE — ED Triage Notes (Signed)
Pt c/o dry cough for over a week. C/o sore throat and chest pain from cough hard. States taking OTC meds with no relief.

## 2022-01-03 DIAGNOSIS — Z3046 Encounter for surveillance of implantable subdermal contraceptive: Secondary | ICD-10-CM | POA: Diagnosis not present

## 2022-01-08 DIAGNOSIS — G8929 Other chronic pain: Secondary | ICD-10-CM | POA: Diagnosis not present

## 2022-01-08 DIAGNOSIS — M25612 Stiffness of left shoulder, not elsewhere classified: Secondary | ICD-10-CM | POA: Diagnosis not present

## 2022-01-08 DIAGNOSIS — M25512 Pain in left shoulder: Secondary | ICD-10-CM | POA: Diagnosis not present

## 2022-01-08 DIAGNOSIS — R29898 Other symptoms and signs involving the musculoskeletal system: Secondary | ICD-10-CM | POA: Diagnosis not present

## 2022-01-08 DIAGNOSIS — Z7409 Other reduced mobility: Secondary | ICD-10-CM | POA: Diagnosis not present

## 2022-01-08 DIAGNOSIS — Z789 Other specified health status: Secondary | ICD-10-CM | POA: Diagnosis not present

## 2022-01-18 DIAGNOSIS — Z7409 Other reduced mobility: Secondary | ICD-10-CM | POA: Diagnosis not present

## 2022-01-18 DIAGNOSIS — G8929 Other chronic pain: Secondary | ICD-10-CM | POA: Diagnosis not present

## 2022-01-18 DIAGNOSIS — M25612 Stiffness of left shoulder, not elsewhere classified: Secondary | ICD-10-CM | POA: Diagnosis not present

## 2022-01-18 DIAGNOSIS — R29898 Other symptoms and signs involving the musculoskeletal system: Secondary | ICD-10-CM | POA: Diagnosis not present

## 2022-01-18 DIAGNOSIS — M25512 Pain in left shoulder: Secondary | ICD-10-CM | POA: Diagnosis not present

## 2022-01-18 DIAGNOSIS — Z789 Other specified health status: Secondary | ICD-10-CM | POA: Diagnosis not present

## 2022-01-21 DIAGNOSIS — Z419 Encounter for procedure for purposes other than remedying health state, unspecified: Secondary | ICD-10-CM | POA: Diagnosis not present

## 2022-01-29 DIAGNOSIS — G8929 Other chronic pain: Secondary | ICD-10-CM | POA: Diagnosis not present

## 2022-01-29 DIAGNOSIS — Z7409 Other reduced mobility: Secondary | ICD-10-CM | POA: Diagnosis not present

## 2022-01-29 DIAGNOSIS — Z789 Other specified health status: Secondary | ICD-10-CM | POA: Diagnosis not present

## 2022-01-29 DIAGNOSIS — M25512 Pain in left shoulder: Secondary | ICD-10-CM | POA: Diagnosis not present

## 2022-01-29 DIAGNOSIS — R29898 Other symptoms and signs involving the musculoskeletal system: Secondary | ICD-10-CM | POA: Diagnosis not present

## 2022-02-21 DIAGNOSIS — Z419 Encounter for procedure for purposes other than remedying health state, unspecified: Secondary | ICD-10-CM | POA: Diagnosis not present

## 2022-03-22 DIAGNOSIS — Z419 Encounter for procedure for purposes other than remedying health state, unspecified: Secondary | ICD-10-CM | POA: Diagnosis not present

## 2022-04-22 DIAGNOSIS — Z419 Encounter for procedure for purposes other than remedying health state, unspecified: Secondary | ICD-10-CM | POA: Diagnosis not present

## 2022-05-22 DIAGNOSIS — Z419 Encounter for procedure for purposes other than remedying health state, unspecified: Secondary | ICD-10-CM | POA: Diagnosis not present

## 2022-06-22 DIAGNOSIS — Z419 Encounter for procedure for purposes other than remedying health state, unspecified: Secondary | ICD-10-CM | POA: Diagnosis not present

## 2022-07-22 DIAGNOSIS — Z419 Encounter for procedure for purposes other than remedying health state, unspecified: Secondary | ICD-10-CM | POA: Diagnosis not present

## 2022-08-22 DIAGNOSIS — Z419 Encounter for procedure for purposes other than remedying health state, unspecified: Secondary | ICD-10-CM | POA: Diagnosis not present

## 2022-09-22 DIAGNOSIS — Z419 Encounter for procedure for purposes other than remedying health state, unspecified: Secondary | ICD-10-CM | POA: Diagnosis not present

## 2022-10-22 DIAGNOSIS — Z419 Encounter for procedure for purposes other than remedying health state, unspecified: Secondary | ICD-10-CM | POA: Diagnosis not present

## 2022-11-22 DIAGNOSIS — Z419 Encounter for procedure for purposes other than remedying health state, unspecified: Secondary | ICD-10-CM | POA: Diagnosis not present

## 2022-12-22 DIAGNOSIS — Z419 Encounter for procedure for purposes other than remedying health state, unspecified: Secondary | ICD-10-CM | POA: Diagnosis not present

## 2023-01-12 ENCOUNTER — Emergency Department (HOSPITAL_COMMUNITY)
Admission: EM | Admit: 2023-01-12 | Discharge: 2023-01-12 | Disposition: A | Discharge status: Home / Self Care | Payer: Medicaid Other | Attending: Emergency Medicine | Admitting: Emergency Medicine

## 2023-01-12 ENCOUNTER — Encounter (HOSPITAL_COMMUNITY): Payer: Self-pay

## 2023-01-12 ENCOUNTER — Other Ambulatory Visit: Payer: Self-pay

## 2023-01-12 ENCOUNTER — Emergency Department (HOSPITAL_COMMUNITY): Payer: Medicaid Other

## 2023-01-12 DIAGNOSIS — Z91040 Latex allergy status: Secondary | ICD-10-CM | POA: Diagnosis not present

## 2023-01-12 DIAGNOSIS — J029 Acute pharyngitis, unspecified: Secondary | ICD-10-CM | POA: Diagnosis present

## 2023-01-12 DIAGNOSIS — Z20822 Contact with and (suspected) exposure to covid-19: Secondary | ICD-10-CM | POA: Insufficient documentation

## 2023-01-12 DIAGNOSIS — R059 Cough, unspecified: Secondary | ICD-10-CM | POA: Diagnosis not present

## 2023-01-12 DIAGNOSIS — J020 Streptococcal pharyngitis: Secondary | ICD-10-CM | POA: Diagnosis not present

## 2023-01-12 LAB — URINALYSIS, ROUTINE W REFLEX MICROSCOPIC
Bacteria, UA: NONE SEEN
Bilirubin Urine: NEGATIVE
Glucose, UA: NEGATIVE mg/dL
Ketones, ur: NEGATIVE mg/dL
Leukocytes,Ua: NEGATIVE
Nitrite: NEGATIVE
Protein, ur: NEGATIVE mg/dL
RBC / HPF: >50 RBC/hpf (ref 0–5)
Specific Gravity, Urine: 1.011 (ref 1.005–1.030)
pH: 6 (ref 5.0–8.0)

## 2023-01-12 LAB — COMPREHENSIVE METABOLIC PANEL
ALT: 14 U/L (ref 0–44)
AST: 16 U/L (ref 15–41)
Albumin: 3.8 g/dL (ref 3.5–5.0)
Alkaline Phosphatase: 36 U/L — ABNORMAL LOW (ref 38–126)
Anion gap: 12 (ref 5–15)
BUN: 11 mg/dL (ref 6–20)
CO2: 25 mmol/L (ref 22–32)
Calcium: 8.8 mg/dL — ABNORMAL LOW (ref 8.9–10.3)
Chloride: 103 mmol/L (ref 98–111)
Creatinine, Ser: 0.82 mg/dL (ref 0.44–1.00)
GFR, Estimated: >60 mL/min (ref >60)
Glucose, Bld: 114 mg/dL — ABNORMAL HIGH (ref 70–99)
Potassium: 3.5 mmol/L (ref 3.5–5.1)
Sodium: 140 mmol/L (ref 135–145)
Total Bilirubin: 0.6 mg/dL (ref <1.2)
Total Protein: 7.4 g/dL (ref 6.5–8.1)

## 2023-01-12 LAB — RESP PANEL BY RT-PCR (RSV, FLU A&B, COVID)  RVPGX2
Influenza A by PCR: NEGATIVE
Influenza B by PCR: NEGATIVE
Resp Syncytial Virus by PCR: NEGATIVE
SARS Coronavirus 2 by RT PCR: NEGATIVE

## 2023-01-12 LAB — CBC
HCT: 40.7 % (ref 36.0–46.0)
Hemoglobin: 13.6 g/dL (ref 12.0–15.0)
MCH: 30.1 pg (ref 26.0–34.0)
MCHC: 33.4 g/dL (ref 30.0–36.0)
MCV: 90 fL (ref 80.0–100.0)
Platelets: 256 10*3/uL (ref 150–400)
RBC: 4.52 MIL/uL (ref 3.87–5.11)
RDW: 12.3 % (ref 11.5–15.5)
WBC: 8 10*3/uL (ref 4.0–10.5)
nRBC: 0 % (ref 0.0–0.2)

## 2023-01-12 LAB — HCG, SERUM, QUALITATIVE: Preg, Serum: NEGATIVE

## 2023-01-12 LAB — GROUP A STREP BY PCR: Group A Strep by PCR: DETECTED — AB

## 2023-01-12 LAB — LIPASE, BLOOD: Lipase: 39 U/L (ref 11–51)

## 2023-01-12 MED ORDER — PENICILLIN G BENZATHINE 1200000 UNIT/2ML IM SUSY
1.2000 10*6.[IU] | PREFILLED_SYRINGE | Freq: Once | INTRAMUSCULAR | Status: AC
  Administered 2023-01-12: 1.2 10*6.[IU] via INTRAMUSCULAR
  Filled 2023-01-12: qty 2

## 2023-01-12 MED ORDER — DEXAMETHASONE SODIUM PHOSPHATE 10 MG/ML IJ SOLN
10.0000 mg | Freq: Once | INTRAMUSCULAR | Status: AC
  Administered 2023-01-12: 10 mg via INTRAMUSCULAR
  Filled 2023-01-12: qty 1

## 2023-01-12 MED ORDER — ONDANSETRON HCL 4 MG PO TABS: 4.0000 mg | ORAL_TABLET | Freq: Four times a day (QID) | ORAL | 0 refills | Status: DC

## 2023-01-12 MED ORDER — BENZONATATE 100 MG PO CAPS: 100.0000 mg | ORAL_CAPSULE | Freq: Three times a day (TID) | ORAL | 0 refills | Status: DC

## 2023-01-12 MED ORDER — LIDOCAINE VISCOUS HCL 2 % MT SOLN
15.0000 mL | Freq: Once | OROMUCOSAL | Status: AC
  Administered 2023-01-12: 15 mL via OROMUCOSAL
  Filled 2023-01-12: qty 15

## 2023-01-12 MED ORDER — ONDANSETRON 4 MG PO TBDP
4.0000 mg | ORAL_TABLET | Freq: Once | ORAL | Status: AC | PRN
  Administered 2023-01-12: 4 mg via ORAL
  Filled 2023-01-12: qty 1

## 2023-01-12 NOTE — ED Notes (Addendum)
Pt was able to hold down the fluids wo n/v.  

## 2023-01-12 NOTE — Discharge Instructions (Signed)
Please follow-up with your primary care provider regards recent ER visit.  Today your labs and imaging show that you have strep throat and you are given 1 shot of antibiotics to fix this.  I have prescribed you Zofran along with Tessalon for your nausea and cough.  You may take Tylenol every 6 hours needed for pain.  Please remain hydrated and eat food as tolerated.  If symptoms change or worsen please return to the ER.

## 2023-01-12 NOTE — ED Triage Notes (Signed)
Patient feeling bad, having shortness of breath with cough. Also states she vomited earlier today. States she has green mucous.

## 2023-01-12 NOTE — ED Provider Notes (Signed)
[place] [facility] Provider Note   CSN: [id number] Arrival date & time: 01/12/23  0002     History  Chief Complaint  Patient presents with   Cough    Kathleen Bell is a 27 y.o. female with no pertinent past medical history presented with cough, 1 episode of emesis and feeling overall poor over the past day.  Patient is unsure of sick contacts.  Patient denies fevers, chest pain but does states she feels mildly short of breath with the cough.  Patient says she is coughing green mucus.  Patient to eat and drink without issue and denies abdominal pain, dysuria, hematuria, vaginal discharge.  Patient states her throat is also sore.  Home Medications Prior to Admission medications   Medication Sig Start Date End Date Taking? Authorizing Provider  benzonatate (TESSALON) 100 MG capsule Take 1 capsule (100 mg total) by mouth every 8 (eight) hours. 01/12/23  Yes Addylynn Balin, Fayrene Fearing T, PA-C  ondansetron (ZOFRAN) 4 MG tablet Take 1 tablet (4 mg total) by mouth every 6 (six) hours. 01/12/23  Yes Evlyn Kanner T, PA-C  amoxicillin-clavulanate (AUGMENTIN) 875-125 MG tablet Take 1 tablet by mouth every 12 (twelve) hours. 01/01/22   Debby Freiberg, NP  promethazine-dextromethorphan (PROMETHAZINE-DM) 6.25-15 MG/5ML syrup Take 5 mLs by mouth 4 (four) times daily as needed for cough. 01/01/22   Debby Freiberg, NP      Allergies    Latex    Review of Systems   Review of Systems  Respiratory:  Positive for cough.     Physical Exam Updated Vital Signs BP 121/87   Pulse 77   Temp 97.9 F (36.6 C)   Resp 18   Ht 5\' 5"  (1.651 m)   Wt 63.5 kg   LMP 01/11/2023   SpO2 98%   BMI 23.30 kg/m  Physical Exam Vitals reviewed.  Constitutional:      General: She is not in acute distress. HENT:     Head: Normocephalic and atraumatic.     Right Ear: Tympanic membrane, ear canal and external ear normal.     Left Ear: Tympanic membrane, ear canal and external  ear normal.     Nose: Nose normal.     Mouth/Throat:     Mouth: Mucous membranes are moist.     Pharynx: Posterior oropharyngeal erythema present. No oropharyngeal exudate.  Eyes:     Extraocular Movements: Extraocular movements intact.     Conjunctiva/sclera: Conjunctivae normal.     Pupils: Pupils are equal, round, and reactive to light.  Cardiovascular:     Rate and Rhythm: Normal rate and regular rhythm.     Pulses: Normal pulses.     Heart sounds: Normal heart sounds.     Comments: 2+ bilateral radial/dorsalis pedis pulses with regular rate Pulmonary:     Effort: Pulmonary effort is normal. No respiratory distress.     Breath sounds: Normal breath sounds.  Abdominal:     Palpations: Abdomen is soft.     Tenderness: There is no abdominal tenderness. There is no guarding or rebound.  Musculoskeletal:        General: Normal range of motion.     Cervical back: Normal range of motion and neck supple.     Comments: 5 out of 5 bilateral grip/leg extension strength  Skin:    General: Skin is warm and dry.     Capillary Refill: Capillary refill takes less than 2 seconds.  Neurological:  General: No focal deficit present.     Mental Status: She is alert and oriented to person, place, and time.     Comments: Sensation intact in all 4 limbs  Psychiatric:        Mood and Affect: Mood normal.     ED Results / Procedures / Treatments   Labs (all labs ordered are listed, but only abnormal results are displayed) Labs Reviewed  GROUP A STREP BY PCR - Abnormal; Notable for the following components:      Result Value   Group A Strep by PCR DETECTED (*)    All other components within normal limits  COMPREHENSIVE METABOLIC PANEL - Abnormal; Notable for the following components:   Glucose, Bld 114 (*)    Calcium 8.8 (*)    Alkaline Phosphatase 36 (*)    All other components within normal limits  URINALYSIS, ROUTINE W REFLEX MICROSCOPIC - Abnormal; Notable for the following  components:   Hgb urine dipstick LARGE (*)    All other components within normal limits  RESP PANEL BY RT-PCR (RSV, FLU A&B, COVID)  RVPGX2  LIPASE, BLOOD  CBC  HCG, SERUM, QUALITATIVE    EKG None  Radiology No results found.  Procedures Procedures    Medications Ordered in ED Medications  ondansetron (ZOFRAN-ODT) disintegrating tablet 4 mg (4 mg Oral Given 01/12/23 0021)  penicillin g benzathine (BICILLIN LA) 1200000 UNIT/2ML injection 1.2 Million Units (1.2 Million Units Intramuscular Given 01/12/23 0732)  dexamethasone (DECADRON) injection 10 mg (10 mg Intramuscular Given 01/12/23 0732)  lidocaine (XYLOCAINE) 2 % viscous mouth solution 15 mL (15 mLs Mouth/Throat Given 01/12/23 0732)    ED Course/ Medical Decision Making/ A&P                                 Medical Decision Making Amount and/or Complexity of Data Reviewed Labs: ordered. Radiology: ordered.  Risk Prescription drug management.   Kathleen Bell 27 y.o. presented today for URI like symptoms. Working DDx that I considered at this time includes, but not limited to, viral illness, pharyngitis, mono, sinusitis, electrolyte abnormality, AOM, pneumonia.  R/o DDx:  viral illness, mono, sinusitis, electrolyte abnormality, AOM, pneumonia: these diagnoses are not consistent with patient's history, presentation, physical exam, labs/imaging findings.  Review of prior external notes: 01/01/2022 ED  Unique Tests and My Interpretation:  Respiratory Panel: Negative Group A Strep: Positive CMP: Unremarkable UA: Unremarkable CBC: Unremarkable hCG serum qualitative: Negative Lipase: Negative Chest x-ray: No acute findings  Social Determinants of Health: none  Discussion with Independent Historian: None  Discussion of Management of Tests: None  Risk: Medium: prescription drug management  Risk Stratification Score: None  Plan: On exam patient was in no acute distress with stable vitals.  Patient's exam is  largely unremarkable and labs from triage do show strep positive and so we will give shot of penicillin here as she has not allergic to this.  Will also give Decadron to help with the cough.  Patient requested a chest x-ray which was ultimately negative.  Will discharge with Tessalon along with Zofran help with any nausea she might have and encouraged her to drink plenty fluids and eat food as tolerated follow-up with her primary care provider.  Patient was p.o. challenged and is safe to be discharged at this time follow-up outpatient.  Patient is not requiring any oxygen and has not required any oxygen at any point during the ED stay.  Patient was given return precautions.patient stable for discharge at this time.  Patient verbalized understanding of plan.  This chart was dictated using voice recognition software.  Despite best efforts to proofread,  errors can occur which can change the documentation meaning.         Final Clinical Impression(s) / ED Diagnoses Final diagnoses:  Strep pharyngitis    Rx / DC Orders ED Discharge Orders          Ordered    ondansetron (ZOFRAN) 4 MG tablet  Every 6 hours        01/12/23 0738    benzonatate (TESSALON) 100 MG capsule  Every 8 hours        01/12/23 0738              Netta Corrigan, PA-C 01/12/23 0820    Gwyneth Sprout, MD 01/12/23 [phone]

## 2023-01-22 DIAGNOSIS — Z419 Encounter for procedure for purposes other than remedying health state, unspecified: Secondary | ICD-10-CM | POA: Diagnosis not present

## 2023-02-14 ENCOUNTER — Encounter (HOSPITAL_COMMUNITY): Payer: Self-pay | Admitting: Emergency Medicine

## 2023-02-14 ENCOUNTER — Ambulatory Visit (HOSPITAL_COMMUNITY)
Admission: EM | Admit: 2023-02-14 | Discharge: 2023-02-14 | Disposition: A | Discharge status: Home / Self Care | Payer: Medicaid Other | Attending: Emergency Medicine | Admitting: Emergency Medicine

## 2023-02-14 DIAGNOSIS — B9689 Other specified bacterial agents as the cause of diseases classified elsewhere: Secondary | ICD-10-CM | POA: Diagnosis not present

## 2023-02-14 DIAGNOSIS — J309 Allergic rhinitis, unspecified: Secondary | ICD-10-CM

## 2023-02-14 DIAGNOSIS — J019 Acute sinusitis, unspecified: Secondary | ICD-10-CM | POA: Diagnosis not present

## 2023-02-14 MED ORDER — AMOXICILLIN-POT CLAVULANATE 875-125 MG PO TABS: 1.0000 | ORAL_TABLET | Freq: Two times a day (BID) | ORAL | 0 refills | Status: AC

## 2023-02-14 MED ORDER — FLUTICASONE PROPIONATE 50 MCG/ACT NA SUSP: 1.0000 | Freq: Every day | NASAL | 2 refills | Status: AC

## 2023-02-14 MED ORDER — CETIRIZINE HCL 10 MG PO TABS: 10.0000 mg | ORAL_TABLET | Freq: Every day | ORAL | 1 refills | Status: AC

## 2023-02-14 MED ORDER — METHYLPREDNISOLONE ACETATE 80 MG/ML IJ SUSP
INTRAMUSCULAR | Status: AC
  Filled 2023-02-14: qty 1

## 2023-02-14 MED ORDER — METHYLPREDNISOLONE ACETATE 80 MG/ML IJ SUSP
60.0000 mg | Freq: Once | INTRAMUSCULAR | Status: AC
  Administered 2023-02-14: 60 mg via INTRAMUSCULAR

## 2023-02-14 MED ORDER — METHYLPREDNISOLONE 4 MG PO TBPK: ORAL_TABLET | ORAL | 0 refills | Status: AC

## 2023-02-14 NOTE — ED Provider Notes (Signed)
[facility]    CSN: [id number] Arrival date & time: 02/14/23  1652    HISTORY   Chief Complaint  Patient presents with   Chills   Cough   Sore Throat   Headache   Nasal Congestion   HPI Kathleen Bell is a pleasant, 28 y.o. female who presents to urgent care today. Patient complains of a 54-month history of nonproductive cough.  Patient states for the past 2 days she has had nasal congestion, chest congestion and a headache as well.  Patient states the headache is located on the front of her face and that she has had thick, green nasal drainage.  Patient states the drainage also runs down her throat, makes her cough and choke.  Patient states she vomited last night after coughing.  Patient states she took Benadryl last night which did not relieve her symptoms.  Patient denies fever, body aches, chills, nausea, vomiting, diarrhea, known sick contacts.  Patient has a mildly elevated temperature on arrival with otherwise normal vital signs.  The history is provided by the patient.   Past Medical History:  Diagnosis Date   Fainting    Medical history non-contributory    Patient Active Problem List   Diagnosis Date Noted   Normal labor 11/18/2015   Abnormal maternal serum screening test 06/22/2015   [redacted] weeks gestation of pregnancy    Past Surgical History:  Procedure Laterality Date   DILATION AND CURETTAGE OF UTERUS     for retained placenta   NO PAST SURGERIES     OB History     Gravida  2   Para  2   Term  2   Preterm      AB      Living  2      SAB      IAB      Ectopic      Multiple  0   Live Births  1          Home Medications    Prior to Admission medications   Medication Sig Start Date End Date Taking? Authorizing Provider  amoxicillin-clavulanate (AUGMENTIN) 875-125 MG tablet Take 1 tablet by mouth 2 (two) times daily for 7 days. 02/14/23 02/21/23 Yes Theadora Rama Scales, PA-C  cetirizine (ZYRTEC ALLERGY) 10 MG tablet Take 1  tablet (10 mg total) by mouth at bedtime. 02/14/23 08/13/23 Yes Theadora Rama Scales, PA-C  fluticasone (FLONASE) 50 MCG/ACT nasal spray Place 1 spray into both nostrils daily. Begin by using 2 sprays in each nare daily for 3 to 5 days, then decrease to 1 spray in each nare daily. 02/14/23  Yes Theadora Rama Scales, PA-C  methylPREDNISolone (MEDROL DOSEPAK) 4 MG TBPK tablet Take 24 mg on day 1, 20 mg on day 2, 16 mg on day 3, 12 mg on day 4, 8 mg on day 5, 4 mg on day 6.  Take all tablets in each row at once, do not spread tablets out throughout the day. 02/14/23  Yes Theadora Rama Scales, PA-C    Family History Family History  Problem Relation Age of Onset   Healthy Mother    Healthy Father    Social History Social History   Tobacco Use   Smoking status: Never   Smokeless tobacco: Never  Vaping Use   Vaping status: Never Used  Substance Use Topics   Alcohol use: Yes   Drug use: No   Allergies   Latex  Review of Systems Review of Systems  Pertinent findings revealed after performing a 14 point review of systems has been noted in the history of present illness.  Physical Exam Vital Signs BP 125/76 (BP Location: Left Arm)   Pulse 77   Temp 99.8 F (37.7 C) (Oral)   Resp 18   SpO2 98%   No data found.  Physical Exam Vitals and nursing note reviewed.  Constitutional:      General: She is not in acute distress.    Appearance: Normal appearance. She is not ill-appearing.  HENT:     Head: Normocephalic and atraumatic.     Salivary Glands: Right salivary gland is not diffusely enlarged or tender. Left salivary gland is not diffusely enlarged or tender.     Right Ear: Hearing, ear canal and external ear normal. No drainage. A middle ear effusion is present. There is no impacted cerumen. Tympanic membrane is bulging. Tympanic membrane is not injected or erythematous.     Left Ear: Hearing, ear canal and external ear normal. No drainage. A middle ear effusion is present. There  is no impacted cerumen. Tympanic membrane is bulging. Tympanic membrane is not injected or erythematous.     Ears:     Comments: Bilateral EACs normal, both TMs bulging with suppurative fluid    Nose: Rhinorrhea present. No nasal deformity, septal deviation, signs of injury, nasal tenderness, mucosal edema or congestion. Rhinorrhea is clear.     Right Nostril: Occlusion present. No foreign body, epistaxis or septal hematoma.     Left Nostril: Occlusion present. No foreign body, epistaxis or septal hematoma.     Right Turbinates: Enlarged, swollen and pale.     Left Turbinates: Enlarged, swollen and pale.     Right Sinus: No maxillary sinus tenderness or frontal sinus tenderness.     Left Sinus: No maxillary sinus tenderness or frontal sinus tenderness.     Mouth/Throat:     Lips: Pink. No lesions.     Mouth: Mucous membranes are moist. No oral lesions.     Pharynx: Oropharynx is clear. Uvula midline. No posterior oropharyngeal erythema or uvula swelling.     Tonsils: No tonsillar exudate. 0 on the right. 0 on the left.     Comments: Purulent postnasal drip Eyes:     General: Lids are normal.        Right eye: No discharge.        Left eye: No discharge.     Extraocular Movements: Extraocular movements intact.     Conjunctiva/sclera: Conjunctivae normal.     Right eye: Right conjunctiva is not injected.     Left eye: Left conjunctiva is not injected.  Neck:     Trachea: Trachea and phonation normal.  Cardiovascular:     Rate and Rhythm: Normal rate and regular rhythm.     Pulses: Normal pulses.     Heart sounds: Normal heart sounds. No murmur heard.    No friction rub. No gallop.  Pulmonary:     Effort: Pulmonary effort is normal. No accessory muscle usage, prolonged expiration or respiratory distress.     Breath sounds: Normal breath sounds. No stridor, decreased air movement or transmitted upper airway sounds. No decreased breath sounds, wheezing, rhonchi or rales.  Chest:      Chest wall: No tenderness.  Musculoskeletal:        General: Normal range of motion.     Cervical back: Normal range of motion and neck supple. Normal range of motion.  Lymphadenopathy:     Cervical:  No cervical adenopathy.  Skin:    General: Skin is warm and dry.     Findings: No erythema or rash.  Neurological:     General: No focal deficit present.     Mental Status: She is alert and oriented to person, place, and time.  Psychiatric:        Mood and Affect: Mood normal.        Behavior: Behavior normal.     Visual Acuity Right Eye Distance:   Left Eye Distance:   Bilateral Distance:    Right Eye Near:   Left Eye Near:    Bilateral Near:     UC Couse / Diagnostics / Procedures:     Radiology No results found.  Procedures Procedures (including critical care time) EKG  Pending results:  Labs Reviewed - No data to display  Medications Ordered in UC: Medications  methylPREDNISolone acetate (DEPO-MEDROL) injection 60 mg (60 mg Intramuscular Given 02/14/23 1803)    UC Diagnoses / Final Clinical Impressions(s)   I have reviewed the triage vital signs and the nursing notes.  Pertinent labs & imaging results that were available during my care of the patient were reviewed by me and considered in my medical decision making (see chart for details).    Final diagnoses:  Acute bacterial sinusitis  Allergic rhinitis, unspecified seasonality, unspecified trigger   Physical exam findings concerning for uncontrolled allergies which has led her to develop a significant bacterial infection in both of her sinuses.  Patient provided with steroid injection during her visit today for relief of upper respiratory inflammation and sinus pressure, a prescription for a tapering dose of methylprednisolone and Augmentin for 7 days.  Patient advised to begin Zyrtec and Flonase and continue them for the next month to prevent symptoms from returning.  Conservative care recommended.  Return  precautions advised.  Please see discharge instructions below for details of plan of care as provided to patient. ED Prescriptions     Medication Sig Dispense Auth. Provider   amoxicillin-clavulanate (AUGMENTIN) 875-125 MG tablet Take 1 tablet by mouth 2 (two) times daily for 7 days. 14 tablet Theadora Rama Scales, PA-C   methylPREDNISolone (MEDROL DOSEPAK) 4 MG TBPK tablet Take 24 mg on day 1, 20 mg on day 2, 16 mg on day 3, 12 mg on day 4, 8 mg on day 5, 4 mg on day 6.  Take all tablets in each row at once, do not spread tablets out throughout the day. 21 tablet Theadora Rama Scales, PA-C   cetirizine (ZYRTEC ALLERGY) 10 MG tablet Take 1 tablet (10 mg total) by mouth at bedtime. 90 tablet Theadora Rama Scales, PA-C   fluticasone (FLONASE) 50 MCG/ACT nasal spray Place 1 spray into both nostrils daily. Begin by using 2 sprays in each nare daily for 3 to 5 days, then decrease to 1 spray in each nare daily. 15.8 mL Theadora Rama Scales, PA-C      PDMP not reviewed this encounter.  Pending results:  Labs Reviewed - No data to display    Discharge Instructions      Your symptoms and my physical exam findings are concerning for exacerbation of your underlying allergies.  This is cause you to develop a bacterial sinus infection which is dripping down the back of your throat and causing you to cough.   Please read below to learn more about the medications, dosages and frequencies that I recommend to help alleviate your symptoms and to get you feeling better soon:  Depo-Medrol IM (methylprednisolone):  To quickly address your significant respiratory inflammation, you were provided with an injection of Solu-Medrol in the office today.  You should continue to feel the full benefit of the steroid for the next 8 to 12 hours.   Medrol Dosepak (methylprednisolone): This is a steroid that will significantly calm your upper and lower airways.  Please take one row of tablets daily with your  breakfast meal starting tomorrow morning until the prescription is complete.      Augmentin (amoxicillin - clavulanic acid):  Please take one (1) dose twice daily for 7 days.  This antibiotic can cause upset stomach, this will resolve once antibiotics are complete.  You are welcome to take a probiotic, eat yogurt, take Imodium while taking this medication.  Please avoid other systemic medications such as Maalox, Pepto-Bismol or milk of magnesia as they can interfere with the body's ability to absorb the antibiotics.    Zyrtec (cetirizine): This is an excellent second-generation antihistamine that helps to reduce respiratory inflammatory response to environmental allergens.  In some patients, this medication can cause daytime sleepiness so I recommend that you take 1 tablet daily at bedtime.  I recommend that you continue this medication for the next 30 days to keep symptoms from returning.   Flonase (fluticasone): This is a steroid nasal spray that used once daily, 1 spray in each nare.  This works best when used on a daily basis. This medication does not work well if it is only used when you think you need it.  After 3 to 5 days of use, you will notice significant reduction of the inflammation and mucus production that is currently being caused by exposure to allergens, whether seasonal or environmental.  The most common side effect of this medication is nosebleeds.  If you experience a nosebleed, please discontinue use for 1 week, then feel free to resume.  If you find that your insurance will not pay for this medication, please consider a different nasal steroids such as Nasonex (mometasone), or Nasacort (triamcinolone).  I recommend that you continue this medication for the next 30 days to keep symptoms from returning.   If symptoms have not meaningfully improved in the next 5 to 7 days, please return for repeat evaluation or follow-up with your regular provider.  If symptoms have worsened in the next 3  to 5 days, please return for repeat evaluation or follow-up with your regular provider.    Thank you for visiting urgent care today.  We appreciate the opportunity to participate in your care.     Disposition Upon Discharge:  Condition: stable for discharge home  Patient presented with an acute illness with associated systemic symptoms and significant discomfort requiring urgent management. In my opinion, this is a condition that a prudent lay person (someone who possesses an average knowledge of health and medicine) may potentially expect to result in complications if not addressed urgently such as respiratory distress, impairment of bodily function or dysfunction of bodily organs.   Routine symptom specific, illness specific and/or disease specific instructions were discussed with the patient and/or caregiver at length.   As such, the patient has been evaluated and assessed, work-up was performed and treatment was provided in alignment with urgent care protocols and evidence based medicine.  Patient/parent/caregiver has been advised that the patient may require follow up for further testing and treatment if the symptoms continue in spite of treatment, as clinically indicated and appropriate.  Patient/parent/caregiver has been advised to return to  the [facility] or PCP if no better; to PCP or the Emergency Department if new signs and symptoms develop, or if the current signs or symptoms continue to change or worsen for further workup, evaluation and treatment as clinically indicated and appropriate  The patient will follow up with their current PCP if and as advised. If the patient does not currently have a PCP we will assist them in obtaining one.   The patient may need specialty follow up if the symptoms continue, in spite of conservative treatment and management, for further workup, evaluation, consultation and treatment as clinically indicated and appropriate.  Patient/parent/caregiver verbalized  understanding and agreement of plan as discussed.  All questions were addressed during visit.  Please see discharge instructions below for further details of plan.  This office note has been dictated using Teaching laboratory technician.  Unfortunately, this method of dictation can sometimes lead to typographical or grammatical errors.  I apologize for your inconvenience in advance if this occurs.  Please do not hesitate to reach out to me if clarification is needed.      Theadora Rama Scales, [place] 02/14/23 [phone]

## 2023-02-14 NOTE — ED Triage Notes (Signed)
Patient presents with nasal congestion, chest congestion and headache x 2 days. Patient c/o non productive cough x 1 month. Patient treated 1 month ago with no relief. Patient states she took Benadryl last night.

## 2023-02-14 NOTE — Discharge Instructions (Signed)
Your symptoms and my physical exam findings are concerning for exacerbation of your underlying allergies.  This is cause you to develop a bacterial sinus infection which is dripping down the back of your throat and causing you to cough.   Please read below to learn more about the medications, dosages and frequencies that I recommend to help alleviate your symptoms and to get you feeling better soon:   Depo-Medrol IM (methylprednisolone):  To quickly address your significant respiratory inflammation, you were provided with an injection of Solu-Medrol in the office today.  You should continue to feel the full benefit of the steroid for the next 8 to 12 hours.   Medrol Dosepak (methylprednisolone): This is a steroid that will significantly calm your upper and lower airways.  Please take one row of tablets daily with your breakfast meal starting tomorrow morning until the prescription is complete.      Augmentin (amoxicillin - clavulanic acid):  Please take one (1) dose twice daily for 7 days.  This antibiotic can cause upset stomach, this will resolve once antibiotics are complete.  You are welcome to take a probiotic, eat yogurt, take Imodium while taking this medication.  Please avoid other systemic medications such as Maalox, Pepto-Bismol or milk of magnesia as they can interfere with the body's ability to absorb the antibiotics.    Zyrtec (cetirizine): This is an excellent second-generation antihistamine that helps to reduce respiratory inflammatory response to environmental allergens.  In some patients, this medication can cause daytime sleepiness so I recommend that you take 1 tablet daily at bedtime.  I recommend that you continue this medication for the next 30 days to keep symptoms from returning.   Flonase (fluticasone): This is a steroid nasal spray that used once daily, 1 spray in each nare.  This works best when used on a daily basis. This medication does not work well if it is only used when  you think you need it.  After 3 to 5 days of use, you will notice significant reduction of the inflammation and mucus production that is currently being caused by exposure to allergens, whether seasonal or environmental.  The most common side effect of this medication is nosebleeds.  If you experience a nosebleed, please discontinue use for 1 week, then feel free to resume.  If you find that your insurance will not pay for this medication, please consider a different nasal steroids such as Nasonex (mometasone), or Nasacort (triamcinolone).  I recommend that you continue this medication for the next 30 days to keep symptoms from returning.   If symptoms have not meaningfully improved in the next 5 to 7 days, please return for repeat evaluation or follow-up with your regular provider.  If symptoms have worsened in the next 3 to 5 days, please return for repeat evaluation or follow-up with your regular provider.    Thank you for visiting urgent care today.  We appreciate the opportunity to participate in your care.

## 2023-02-22 DIAGNOSIS — Z419 Encounter for procedure for purposes other than remedying health state, unspecified: Secondary | ICD-10-CM | POA: Diagnosis not present

## 2023-03-22 DIAGNOSIS — Z419 Encounter for procedure for purposes other than remedying health state, unspecified: Secondary | ICD-10-CM | POA: Diagnosis not present

## 2023-05-03 DIAGNOSIS — Z419 Encounter for procedure for purposes other than remedying health state, unspecified: Secondary | ICD-10-CM | POA: Diagnosis not present

## 2023-06-02 DIAGNOSIS — Z419 Encounter for procedure for purposes other than remedying health state, unspecified: Secondary | ICD-10-CM | POA: Diagnosis not present
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
## 2023-07-03 DIAGNOSIS — Z419 Encounter for procedure for purposes other than remedying health state, unspecified: Secondary | ICD-10-CM | POA: Diagnosis not present

## 2023-08-02 DIAGNOSIS — Z419 Encounter for procedure for purposes other than remedying health state, unspecified: Secondary | ICD-10-CM | POA: Diagnosis not present

## 2023-08-08 DIAGNOSIS — G8929 Other chronic pain: Secondary | ICD-10-CM | POA: Diagnosis not present

## 2023-08-08 DIAGNOSIS — M25512 Pain in left shoulder: Secondary | ICD-10-CM | POA: Diagnosis not present

## 2023-09-02 DIAGNOSIS — Z419 Encounter for procedure for purposes other than remedying health state, unspecified: Secondary | ICD-10-CM | POA: Diagnosis not present

## 2023-09-18 ENCOUNTER — Encounter (HOSPITAL_COMMUNITY): Payer: Self-pay

## 2023-09-18 ENCOUNTER — Ambulatory Visit (HOSPITAL_COMMUNITY)
Admission: EM | Admit: 2023-09-18 | Discharge: 2023-09-18 | Disposition: A | Discharge status: Home / Self Care | Attending: Family Medicine | Admitting: Family Medicine

## 2023-09-18 DIAGNOSIS — M79671 Pain in right foot: Secondary | ICD-10-CM | POA: Diagnosis not present

## 2023-09-18 DIAGNOSIS — W540XXA Bitten by dog, initial encounter: Secondary | ICD-10-CM | POA: Diagnosis not present

## 2023-09-18 DIAGNOSIS — Z23 Encounter for immunization: Secondary | ICD-10-CM | POA: Diagnosis not present

## 2023-09-18 MED ORDER — TETANUS-DIPHTH-ACELL PERTUSSIS 5-2.5-18.5 LF-MCG/0.5 IM SUSY
PREFILLED_SYRINGE | INTRAMUSCULAR | Status: AC
  Filled 2023-09-18: qty 0.5

## 2023-09-18 MED ORDER — AMOXICILLIN-POT CLAVULANATE 875-125 MG PO TABS: 1.0000 | ORAL_TABLET | Freq: Two times a day (BID) | ORAL | 0 refills | Status: AC

## 2023-09-18 MED ORDER — TETANUS-DIPHTH-ACELL PERTUSSIS 5-2.5-18.5 LF-MCG/0.5 IM SUSY
0.5000 mL | PREFILLED_SYRINGE | Freq: Once | INTRAMUSCULAR | Status: AC
  Administered 2023-09-18: 0.5 mL via INTRAMUSCULAR

## 2023-09-18 NOTE — ED Triage Notes (Signed)
 Patient states she was bit by her own dog under the right pinky toe lst night. Patient is up to date on vaccinations.

## 2023-09-18 NOTE — Discharge Instructions (Signed)
Meds ordered this encounter  ?Medications  ? Tdap (BOOSTRIX) injection 0.5 mL  ? amoxicillin-clavulanate (AUGMENTIN) 875-125 MG tablet  ?  Sig: Take 1 tablet by mouth every 12 (twelve) hours.  ?  Dispense:  14 tablet  ?  Refill:  0  ? ? ?

## 2023-09-18 NOTE — ED Provider Notes (Addendum)
  [facility]   [id number] 09/18/23 Arrival Time: 1109  ASSESSMENT & PLAN:  1. Right foot pain   2. Dog bite, initial encounter    Reports dog's immunizations are UTD; no concern for rabies.  Meds ordered this encounter  Medications   Tdap (BOOSTRIX ) injection 0.5 mL   amoxicillin -clavulanate (AUGMENTIN ) 875-125 MG tablet    Sig: Take 1 tablet by mouth every 12 (twelve) hours.    Dispense:  14 tablet    Refill:  0   No signs of infection at this time. Printed Rx for Augmentin  given; to watch and fill if any s/s of infection.  Reviewed expectations re: course of current medical issues. Questions answered. Outlined signs and symptoms indicating need for more acute intervention. Patient verbalized understanding. After Visit Summary given.   SUBJECTIVE:  Kathleen Bell is a 28 y.o. female who presents with a puncture wound to R distal plantar foot near 5th toe; yesterday evening; bleeding controlled. Her dog bit her after she accidentally stepped on dog.  Td UTD: she does not think so.   OBJECTIVE:  Vitals:   09/18/23 1143  BP: (!) 120/93  Pulse: 77  Resp: 14  Temp: 98.2 F (36.8 C)  TempSrc: Oral  SpO2: 100%     General appearance: alert; no distress RLE: single puncture wound of R distal plantar foot at 5th toe; without bleeding/TTP/swelling Psychological: alert and cooperative; normal mood and affect    Labs Reviewed - No data to display  No results found.  Allergies  Allergen Reactions   Latex Itching    Past Medical History:  Diagnosis Date   Fainting    Medical history non-contributory    Social History   Socioeconomic History   Marital status: Single    Spouse name: Not on file   Number of children: Not on file   Years of education: Not on file   Highest education level: Not on file  Occupational History   Not on file  Tobacco Use   Smoking status: Never   Smokeless tobacco: Never  Vaping Use   Vaping status: Never Used   Substance and Sexual Activity   Alcohol use: Yes   Drug use: No   Sexual activity: Yes    Birth control/protection: Implant  Other Topics Concern   Not on file  Social History Narrative   Not on file   Social Drivers of Health   Financial Resource Strain: Not on file  Food Insecurity: Not on file  Transportation Needs: Not on file  Physical Activity: Not on file  Stress: Not on file  Social Connections: Unknown (06/05/2021)   Received from [facility]   Social Network    Social Network: Not on file          Wells, MD 09/18/23 1259    Rolinda Rogue, MD 09/18/23 1259

## 2023-10-03 DIAGNOSIS — Z419 Encounter for procedure for purposes other than remedying health state, unspecified: Secondary | ICD-10-CM | POA: Diagnosis not present

## 2023-11-02 DIAGNOSIS — Z419 Encounter for procedure for purposes other than remedying health state, unspecified: Secondary | ICD-10-CM | POA: Diagnosis not present
